# Patient Record
Sex: Female | Born: 1950 | Race: Black or African American | Hispanic: No | Smoking: Former smoker
Health system: Southern US, Community
[De-identification: ages and names within clinical notes are randomized; demographics above are authoritative.]

## PROBLEM LIST (undated history)

## (undated) DIAGNOSIS — I10 Essential (primary) hypertension: Secondary | ICD-10-CM

## (undated) DIAGNOSIS — K219 Gastro-esophageal reflux disease without esophagitis: Secondary | ICD-10-CM

## (undated) HISTORY — DX: Gastro-esophageal reflux disease without esophagitis: K21.9

## (undated) HISTORY — DX: Essential (primary) hypertension: I10

## (undated) HISTORY — PX: TUBAL LIGATION: SHX77

---

## 2010-05-18 ENCOUNTER — Encounter: Admission: RE | Admit: 2010-05-18 | Discharge: 2010-05-18 | Payer: Self-pay | Admitting: Infectious Diseases

## 2011-01-23 ENCOUNTER — Encounter: Payer: Self-pay | Admitting: Internal Medicine

## 2011-01-23 ENCOUNTER — Ambulatory Visit (INDEPENDENT_AMBULATORY_CARE_PROVIDER_SITE_OTHER): Payer: BC Managed Care – PPO | Admitting: Internal Medicine

## 2011-01-23 DIAGNOSIS — K219 Gastro-esophageal reflux disease without esophagitis: Secondary | ICD-10-CM | POA: Insufficient documentation

## 2011-01-23 DIAGNOSIS — H6120 Impacted cerumen, unspecified ear: Secondary | ICD-10-CM

## 2011-01-23 DIAGNOSIS — Z0000 Encounter for general adult medical examination without abnormal findings: Secondary | ICD-10-CM

## 2011-01-23 DIAGNOSIS — I10 Essential (primary) hypertension: Secondary | ICD-10-CM | POA: Insufficient documentation

## 2011-01-23 DIAGNOSIS — M25579 Pain in unspecified ankle and joints of unspecified foot: Secondary | ICD-10-CM | POA: Insufficient documentation

## 2011-01-23 DIAGNOSIS — R209 Unspecified disturbances of skin sensation: Secondary | ICD-10-CM | POA: Insufficient documentation

## 2011-01-24 ENCOUNTER — Other Ambulatory Visit: Payer: BC Managed Care – PPO

## 2011-01-24 ENCOUNTER — Encounter (INDEPENDENT_AMBULATORY_CARE_PROVIDER_SITE_OTHER): Payer: Self-pay | Admitting: *Deleted

## 2011-01-24 ENCOUNTER — Other Ambulatory Visit: Payer: Self-pay | Admitting: Internal Medicine

## 2011-01-24 DIAGNOSIS — M79609 Pain in unspecified limb: Secondary | ICD-10-CM

## 2011-01-24 DIAGNOSIS — R209 Unspecified disturbances of skin sensation: Secondary | ICD-10-CM

## 2011-01-24 LAB — URINALYSIS
Hgb urine dipstick: NEGATIVE
Ketones, ur: NEGATIVE
Leukocytes, UA: NEGATIVE
Nitrite: NEGATIVE
Specific Gravity, Urine: 1.01 (ref 1.000–1.030)
Total Protein, Urine: NEGATIVE
Urine Glucose: NEGATIVE
pH: 7.5 (ref 5.0–8.0)

## 2011-01-24 LAB — BASIC METABOLIC PANEL
BUN: 8 mg/dL (ref 6–23)
CO2: 30 mEq/L (ref 19–32)
Calcium: 9.8 mg/dL (ref 8.4–10.5)
Chloride: 106 mEq/L (ref 96–112)
GFR: 101.85 mL/min (ref >60.00)
Glucose, Bld: 91 mg/dL (ref 70–99)
Potassium: 4.2 mEq/L (ref 3.5–5.1)

## 2011-01-24 LAB — CBC WITH DIFFERENTIAL/PLATELET
Basophils Absolute: 0 10*3/uL (ref 0.0–0.1)
Eosinophils Absolute: 0.1 10*3/uL (ref 0.0–0.7)
HCT: 40.6 % (ref 36.0–46.0)
Lymphocytes Relative: 54.6 % — ABNORMAL HIGH (ref 12.0–46.0)
Monocytes Absolute: 0.3 10*3/uL (ref 0.1–1.0)
Platelets: 236 10*3/uL (ref 150.0–400.0)

## 2011-01-24 LAB — URIC ACID: Uric Acid, Serum: 4.1 mg/dL (ref 2.4–7.0)

## 2011-01-24 LAB — VITAMIN B12: Vitamin B-12: 1118 pg/mL — ABNORMAL HIGH (ref 211–911)

## 2011-01-24 LAB — LIPID PANEL
Total CHOL/HDL Ratio: 3
Triglycerides: 55 mg/dL (ref 0.0–149.0)

## 2011-01-25 ENCOUNTER — Telehealth: Payer: Self-pay | Admitting: Internal Medicine

## 2011-02-01 NOTE — Progress Notes (Signed)
Summary: RESULTS  Phone Note Call from Patient Call back at [facility] Phone [phone]   Summary of Call: Pt recieved results of labs today but says she did not get u/a results. Per MD - it is normal. Need to call pt to inform.  Initial call taken by: Lamar Sprinkles, CMA,  January 25, 2011 12:39 PM  Follow-up for Phone Call        left vm for pt, u/a ok  Follow-up by: Lamar Sprinkles, CMA,  January 25, 2011 5:39 PM

## 2011-02-01 NOTE — Assessment & Plan Note (Signed)
Summary: New / Bcbs / # / Dr Posey Rea ok'd   Vital Signs:  Patient profile:   60 year old female Height:      62.5 inches Weight:      140 pounds BMI:     25.29 Temp:     98.6 degrees F oral Pulse rate:   92 / minute Pulse rhythm:   regular Resp:     16 per minute BP sitting:   148 / 98  (left arm) Cuff size:   regular  Vitals Entered By: Lanier Prude, CMA(AAMA) (January 23, 2011 3:02 PM) CC: Est PCP Is Patient Diabetic? No   CC:  Est PCP.  History of Present Illness: New pt -  The patient presents for a preventive health examination  C/o R ankle was swollen and tender x 1 month, well now  Preventive Screening-Counseling & Management  Alcohol-Tobacco     Smoking Status: never  Caffeine-Diet-Exercise     Does Patient Exercise: yes  Current Medications (verified): 1)  Atenolol 50 Mg Tabs (Atenolol) .Marland Kitchen.. 1 By Mouth Once Daily 2)  Prilosec 20 Mg Cpdr (Omeprazole) .Marland Kitchen.. 1 By Mouth Once Daily 3)  Mag-Sr Plus Calcium 535 (64 Mg) Mg Cr-Tabs (Magnesium Chloride) .Marland Kitchen.. 1 By Mouth Once Daily 4)  Biotin 10 Mg Tabs (Biotin) .Marland Kitchen.. 1 By Mouth Once Daily 5)  Potassium Gluconate 2 Meq Tabs (Potassium Gluconate) .Marland Kitchen.. 1 By Mouth Once Daily 6)  Vitamin C 500 Mg Tabs (Ascorbic Acid) .Marland Kitchen.. 1 By Mouth Once Daily 7)  Fish Oil 300 Mg Caps (Omega-3 Fatty Acids) .Marland Kitchen.. 1 By Mouth Once Daily 8)  Vitamin B-12 100 Mcg Tabs (Cyanocobalamin) .Marland Kitchen.. 1 By Mouth Once Daily 9)  Vitamin D3 400 Unit Tabs (Cholecalciferol) .Marland Kitchen.. 1 By Mouth Once Daily  Allergies (verified): No Known Drug Allergies  Past History:  Past Medical History: ?gout Hypertension GERD  Past Surgical History: Tubal ligation Colonosc 2009 in [place]  Family History: F gout Family History Diabetes 1st degree relative Family History Hypertension  Social History: Occupation: caregiver Widow/Widower Never Smoked Alcohol use-no Regular exercise-yes Smoking Status:  never Does Patient Exercise:  yes  Review of  Systems  The patient denies anorexia, fever, weight loss, weight gain, vision loss, decreased hearing, hoarseness, chest pain, syncope, dyspnea on exertion, peripheral edema, prolonged cough, headaches, hemoptysis, abdominal pain, melena, hematochezia, severe indigestion/heartburn, hematuria, incontinence, genital sores, muscle weakness, suspicious skin lesions, transient blindness, difficulty walking, depression, unusual weight change, abnormal bleeding, enlarged lymph nodes, angioedema, and breast masses.         BP has been nl off meds  Physical Exam  General:  Well-developed,well-nourished,in no acute distress; alert,appropriate and cooperative throughout examination Head:  Normocephalic and atraumatic without obvious abnormalities. No apparent alopecia or balding. Eyes:  No corneal or conjunctival inflammation noted. EOMI. Perrla. Ears:  External ear exam shows no significant lesions or deformities.  Otoscopic examination reveals clear canals, tympanic membranes are intact bilaterally without bulging, retraction, inflammation or discharge after irrigation.Large wax B Nose:  External nasal examination shows no deformity or inflammation. Nasal mucosa are pink and moist without lesions or exudates. Mouth:  Oral mucosa and oropharynx without lesions or exudates.  Teeth in good repair. Neck:  No deformities, masses, or tenderness noted. Lungs:  Normal respiratory effort, chest expands symmetrically. Lungs are clear to auscultation, no crackles or wheezes. Heart:  Normal rate and regular rhythm. S1 and S2 normal without gallop, murmur, click, rub or other extra sounds. Abdomen:  Bowel sounds positive,abdomen soft  and non-tender without masses, organomegaly or hernias noted. Msk:  No deformity or scoliosis noted of thoracic or lumbar spine.   Pulses:  R and L carotid,radial,femoral,dorsalis pedis and posterior tibial pulses are full and equal bilaterally Extremities:  No clubbing, cyanosis, edema,  or deformity noted with normal full range of motion of all joints.   Neurologic:  No cranial nerve deficits noted. Station and gait are normal. Plantar reflexes are down-going bilaterally. DTRs are symmetrical throughout. Sensory, motor and coordinative functions appear intact. Skin:  Intact without suspicious lesions or rashes Cervical Nodes:  No lymphadenopathy noted Psych:  Cognition and judgment appear intact. Alert and cooperative with normal attention span and concentration. No apparent delusions, illusions, hallucinations   Impression & Recommendations:  Problem # 1:  HEALTH MAINTENANCE EXAM (ICD-V70.0) Assessment New See your GYN. Get a mammo. Colon iis up to date. Get labs Health and age related issues were discussed. Available screening tests and vaccinations were discussed as well. Healthy life style including good diet and exercise was discussed.  Orders: EKG w/ Interpretation (93000)OK  Problem # 2:  HYPERTENSION (ICD-401.9) Assessment: Comment Only Nl BP off Rx Her updated medication list for this problem includes:    Atenolol 50 Mg Tabs (Atenolol) .Marland Kitchen... 1 by mouth once daily  Problem # 3:  GERD (ICD-530.81) Assessment: Improved  Her updated medication list for this problem includes:    Prilosec 20 Mg Cpdr (Omeprazole) .Marland Kitchen... 1 by mouth once daily  Problem # 4:  ANKLE PAIN (ICD-719.47) R - resolved Assessment: New r/o gout  Problem # 5:  CERUMEN IMPACTION (ICD-380.4) B Assessment: New  Orders: Cerumen Impaction Removal (04540) Procedure: ear irrigation Reason: wax impaction B Risks/benefis were discussed. Both ears were irrigated with warm water. Large ammount of wax was recovered. Instrumentation with metal ear loop was performed to accomplish the removal. Tolerated well Complications: none, except for a minor bleeding on L    Complete Medication List: 1)  Atenolol 50 Mg Tabs (Atenolol) .Marland Kitchen.. 1 by mouth once daily 2)  Prilosec 20 Mg Cpdr (Omeprazole) .Marland Kitchen..  1 by mouth once daily 3)  Mag-sr Plus Calcium 535 (64 Mg) Mg Cr-tabs (Magnesium chloride) .Marland Kitchen.. 1 by mouth once daily 4)  Biotin 10 Mg Tabs (Biotin) .Marland Kitchen.. 1 by mouth once daily 5)  Potassium Gluconate 2 Meq Tabs (Potassium gluconate) .Marland Kitchen.. 1 by mouth once daily 6)  Vitamin C 500 Mg Tabs (Ascorbic acid) .Marland Kitchen.. 1 by mouth once daily 7)  Fish Oil 300 Mg Caps (Omega-3 fatty acids) .Marland Kitchen.. 1 by mouth once daily 8)  Vitamin B-12 100 Mcg Tabs (Cyanocobalamin) .Marland Kitchen.. 1 by mouth once daily 9)  Vitamin D3 400 Unit Tabs (Cholecalciferol) .Marland Kitchen.. 1 by mouth once daily 10)  Cortisporin 3.5-10000-1 Soln (Neomycin-polymyxin-hc) .... 3 gtt in r ear tid  Patient Instructions: 1)  Tomorrow:v70.0 2)  BMP prior to visit, ICD-9: 3)  Hepatic Panel prior to visit, ICD-9: 4)  Lipid Panel prior to visit, ICD-9: 5)  TSH prior to visit, ICD-9: 6)  CBC w/ Diff prior to visit, ICD-9: 7)  Urine-dip prior to visit, ICD-9: 8)  uric acid 729.5 9)  Please schedule a follow-up appointment in 3 months. Prescriptions: CORTISPORIN 3.5-10000-1 SOLN (NEOMYCIN-POLYMYXIN-HC) 3 gtt in R ear tid  #1 x 1   Entered and Authorized by:   Tresa Garter MD   Signed by:   Tresa Garter MD on 01/23/2011   Method used:   Print then Give to Patient   RxID:  [id number]    Orders Added: 1)  New Patient 40-64 years [99386] 2)  EKG w/ Interpretation [93000] 3)  Cerumen Impaction Removal [69210]

## 2011-04-02 ENCOUNTER — Encounter: Payer: Self-pay | Admitting: Internal Medicine

## 2011-04-02 ENCOUNTER — Ambulatory Visit (INDEPENDENT_AMBULATORY_CARE_PROVIDER_SITE_OTHER): Payer: BC Managed Care – PPO | Admitting: Internal Medicine

## 2011-04-02 DIAGNOSIS — R519 Headache, unspecified: Secondary | ICD-10-CM | POA: Insufficient documentation

## 2011-04-02 DIAGNOSIS — J019 Acute sinusitis, unspecified: Secondary | ICD-10-CM

## 2011-04-02 DIAGNOSIS — R51 Headache: Secondary | ICD-10-CM

## 2011-04-02 MED ORDER — AMOXICILLIN 500 MG PO CAPS: 1000.0000 mg | ORAL_CAPSULE | Freq: Two times a day (BID) | ORAL | Status: AC

## 2011-04-02 MED ORDER — IBUPROFEN 600 MG PO TABS: ORAL_TABLET | ORAL | Status: AC

## 2011-04-02 NOTE — Progress Notes (Signed)
  Subjective:    Patient ID: Dana Long, female    DOB: Jul 09, 1951, 60 y.o.   MRN: [id number]  HPI  C/o L face pain x 2-3 wks - her dentist said her L sinus was full  Review of Systems  Constitutional: Negative for fever.  HENT: Negative for hearing loss, nosebleeds, trouble swallowing, neck pain and sinus pressure.   Genitourinary: Negative for frequency.  Skin: Negative for rash.  Neurological: Negative for weakness.  Psychiatric/Behavioral: Negative for suicidal ideas and dysphoric mood.       Objective:   Physical Exam  Constitutional: She appears well-developed and well-nourished. No distress.  HENT:  Head: Normocephalic.  Right Ear: External ear normal.  Left Ear: External ear normal.  Nose: Nose normal.  Mouth/Throat: Oropharynx is clear and moist.  Eyes: Conjunctivae are normal. Pupils are equal, round, and reactive to light. Right eye exhibits no discharge. Left eye exhibits no discharge.  Neck: Normal range of motion. Neck supple. No JVD present. No tracheal deviation present. No thyromegaly present.  Cardiovascular: Normal rate, regular rhythm and normal heart sounds.   Pulmonary/Chest: No stridor. No respiratory distress. She has no wheezes.  Abdominal: Soft. Bowel sounds are normal. She exhibits no distension and no mass. There is no tenderness. There is no rebound and no guarding.  Musculoskeletal: She exhibits no edema and no tenderness.       L TMJ is tender  Lymphadenopathy:    She has no cervical adenopathy.  Neurological: She displays normal reflexes. No cranial nerve deficit. She exhibits normal muscle tone. Coordination normal.  Skin: No rash noted. No erythema.  Psychiatric: She has a normal mood and affect. Her behavior is normal. Judgment and thought content normal.          Assessment & Plan:   Headache Sinusitis vs TMJ - see meds  Sinusitis acute Amoxicillin x 10 d

## 2011-04-02 NOTE — Assessment & Plan Note (Signed)
Sinusitis vs TMJ - see meds

## 2011-04-02 NOTE — Assessment & Plan Note (Signed)
Amoxicillin x10d 

## 2011-04-20 ENCOUNTER — Other Ambulatory Visit: Payer: Self-pay | Admitting: Internal Medicine

## 2011-04-20 ENCOUNTER — Other Ambulatory Visit (INDEPENDENT_AMBULATORY_CARE_PROVIDER_SITE_OTHER): Payer: BC Managed Care – PPO

## 2011-04-20 DIAGNOSIS — T887XXA Unspecified adverse effect of drug or medicament, initial encounter: Secondary | ICD-10-CM

## 2011-04-20 DIAGNOSIS — I10 Essential (primary) hypertension: Secondary | ICD-10-CM

## 2011-04-24 ENCOUNTER — Encounter: Payer: Self-pay | Admitting: Internal Medicine

## 2011-04-25 ENCOUNTER — Encounter: Payer: Self-pay | Admitting: Internal Medicine

## 2011-04-25 ENCOUNTER — Ambulatory Visit (INDEPENDENT_AMBULATORY_CARE_PROVIDER_SITE_OTHER): Payer: BC Managed Care – PPO | Admitting: Internal Medicine

## 2011-04-25 DIAGNOSIS — I10 Essential (primary) hypertension: Secondary | ICD-10-CM

## 2011-04-25 DIAGNOSIS — J019 Acute sinusitis, unspecified: Secondary | ICD-10-CM

## 2011-04-25 DIAGNOSIS — J309 Allergic rhinitis, unspecified: Secondary | ICD-10-CM

## 2011-04-25 DIAGNOSIS — R51 Headache: Secondary | ICD-10-CM

## 2011-04-25 DIAGNOSIS — K219 Gastro-esophageal reflux disease without esophagitis: Secondary | ICD-10-CM

## 2011-04-25 DIAGNOSIS — J302 Other seasonal allergic rhinitis: Secondary | ICD-10-CM

## 2011-04-25 MED ORDER — FLUTICASONE PROPIONATE 50 MCG/ACT NA SUSP: 2.0000 | Freq: Every day | NASAL | Status: DC

## 2011-04-25 MED ORDER — LORATADINE 10 MG PO TABS: 10.0000 mg | ORAL_TABLET | Freq: Every day | ORAL | Status: DC

## 2011-04-25 MED ORDER — MOXIFLOXACIN HCL 400 MG PO TABS: 400.0000 mg | ORAL_TABLET | Freq: Every day | ORAL | Status: DC

## 2011-04-25 MED ORDER — TRIAMTERENE-HCTZ 37.5-25 MG PO TABS: 1.0000 | ORAL_TABLET | Freq: Every day | ORAL | Status: DC

## 2011-04-25 NOTE — Patient Instructions (Signed)
Use Nasal rinse

## 2011-04-25 NOTE — Assessment & Plan Note (Signed)
BP Readings from Last 3 Encounters:  04/25/11 156/90  04/02/11 130/90  01/23/11 148/98  Given Maxzide

## 2011-04-25 NOTE — Assessment & Plan Note (Addendum)
CT head if not better after Rx

## 2011-04-25 NOTE — Progress Notes (Signed)
  Subjective:    Patient ID: Dana Long, female    DOB: 1951/10/18, 60 y.o.   MRN: [id number]  HPI  C/o HA in L and occipital area Abx helped some. No chills. F/u sinusitis - 30% better on abx. States BP is OK at home  Review of Systems  Constitutional: Positive for fatigue. Negative for activity change and unexpected weight change.  HENT: Positive for congestion, sneezing, postnasal drip and sinus pressure. Negative for neck pain.   Eyes: Negative for photophobia, pain and visual disturbance.  Cardiovascular: Negative for leg swelling.  Skin: Negative for rash.  Neurological: Negative for syncope.  Psychiatric/Behavioral: Negative for dysphoric mood.       Objective:   Physical Exam  Constitutional: She appears well-developed and well-nourished. No distress.  HENT:  Head: Normocephalic.  Right Ear: External ear normal.  Left Ear: External ear normal.  Nose: Nose normal.  Mouth/Throat: Oropharynx is clear and moist.  Eyes: Conjunctivae are normal. Pupils are equal, round, and reactive to light. Right eye exhibits no discharge. Left eye exhibits no discharge.  Neck: Normal range of motion. Neck supple. No JVD present. No tracheal deviation present. No thyromegaly present.  Cardiovascular: Normal rate, regular rhythm and normal heart sounds.   Pulmonary/Chest: No stridor. No respiratory distress. She has no wheezes.  Abdominal: Soft. Bowel sounds are normal. She exhibits no distension and no mass. There is no tenderness. There is no rebound and no guarding.  Musculoskeletal: She exhibits no edema and no tenderness.  Lymphadenopathy:    She has no cervical adenopathy.  Neurological: She displays normal reflexes. No cranial nerve deficit. She exhibits normal muscle tone. Coordination normal.  Skin: No rash noted. No erythema.  Psychiatric: She has a normal mood and affect. Her behavior is normal. Judgment and thought content normal.  L TMJ tender L sinus ?tender         Assessment & Plan:  Sinusitis acute Acute vs chronic Irrigate. Finish Amoxicillin She is leaving x 2 wks on Sat Pick up Avelox Flonase   HYPERTENSION BP Readings from Last 3 Encounters:  04/25/11 156/90  04/02/11 130/90  01/23/11 148/98  Given Maxzide   Headache CT head if not better after Rx  GERD On Rx  Allergic rhinitis, seasonal Start Claritin

## 2011-04-25 NOTE — Assessment & Plan Note (Signed)
On Rx 

## 2011-04-25 NOTE — Assessment & Plan Note (Signed)
Acute vs chronic Irrigate. Finish Amoxicillin She is leaving x 2 wks on Sat Pick up Avelox Flonase

## 2011-04-25 NOTE — Assessment & Plan Note (Signed)
Start Claritin 

## 2011-07-20 ENCOUNTER — Other Ambulatory Visit (INDEPENDENT_AMBULATORY_CARE_PROVIDER_SITE_OTHER): Payer: BC Managed Care – PPO

## 2011-07-20 DIAGNOSIS — I10 Essential (primary) hypertension: Secondary | ICD-10-CM

## 2011-07-20 LAB — BASIC METABOLIC PANEL
BUN: 11 mg/dL (ref 6–23)
Chloride: 96 mEq/L (ref 96–112)
Potassium: 3.5 mEq/L (ref 3.5–5.1)

## 2011-07-24 ENCOUNTER — Ambulatory Visit (INDEPENDENT_AMBULATORY_CARE_PROVIDER_SITE_OTHER): Payer: BC Managed Care – PPO | Admitting: Internal Medicine

## 2011-07-24 ENCOUNTER — Encounter: Payer: Self-pay | Admitting: Internal Medicine

## 2011-07-24 VITALS — BP 130/98 | HR 76 | Temp 97.8°F | Resp 16

## 2011-07-24 DIAGNOSIS — R51 Headache: Secondary | ICD-10-CM

## 2011-07-24 DIAGNOSIS — R7611 Nonspecific reaction to tuberculin skin test without active tuberculosis: Secondary | ICD-10-CM

## 2011-07-24 DIAGNOSIS — J019 Acute sinusitis, unspecified: Secondary | ICD-10-CM

## 2011-07-24 MED ORDER — VITAMIN D 1000 UNITS PO TABS: 1000.0000 [IU] | ORAL_TABLET | Freq: Every day | ORAL | Status: DC

## 2011-07-24 MED ORDER — AMOXICILLIN 500 MG PO CAPS: 1000.0000 mg | ORAL_CAPSULE | Freq: Two times a day (BID) | ORAL | Status: AC

## 2011-07-24 NOTE — Assessment & Plan Note (Signed)
Not much better. CT sinuses Amoxicillin if worse

## 2011-07-24 NOTE — Assessment & Plan Note (Signed)
Relapsed. CT. Amoxicillin

## 2011-07-24 NOTE — Assessment & Plan Note (Signed)
Will get a CXR 

## 2011-07-24 NOTE — Progress Notes (Signed)
  Subjective:    Patient ID: Dana Long, female    DOB: 1951-01-02, 60 y.o.   MRN: [id number]  HPI    The patient is here to follow up on  headaches and chronic sinus sx's symptoms x several weeks. 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  HENT: Positive for congestion. Negative for mouth sores and sinus pressure.   Eyes: Negative for visual disturbance.  Respiratory: Negative for cough and chest tightness.   Gastrointestinal: Negative for nausea and abdominal pain.  Genitourinary: Negative for frequency, difficulty urinating and vaginal pain.  Musculoskeletal: Negative for back pain and gait problem.  Skin: Negative for pallor and rash.  Neurological: Negative for dizziness, tremors, weakness, numbness and headaches.  Psychiatric/Behavioral: Negative for confusion and sleep disturbance.       Objective:   Physical Exam  Constitutional: She appears well-developed and well-nourished. No distress.  HENT:  Head: Normocephalic.  Right Ear: External ear normal.  Left Ear: External ear normal.  Nose: Nose normal.  Mouth/Throat: Oropharynx is clear and moist.  Eyes: Conjunctivae are normal. Pupils are equal, round, and reactive to light. Right eye exhibits no discharge. Left eye exhibits no discharge.  Neck: Normal range of motion. Neck supple. No JVD present. No tracheal deviation present. No thyromegaly present.  Cardiovascular: Normal rate, regular rhythm and normal heart sounds.   Pulmonary/Chest: No stridor. No respiratory distress. She has no wheezes.  Abdominal: Soft. Bowel sounds are normal. She exhibits no distension and no mass. There is no tenderness. There is no rebound and no guarding.  Musculoskeletal: She exhibits no edema and no tenderness.  Lymphadenopathy:    She has no cervical adenopathy.  Neurological: She displays normal  reflexes. No cranial nerve deficit. She exhibits normal muscle tone. Coordination normal.  Skin: No rash noted. No erythema.  Psychiatric: She has a normal mood and affect. Her behavior is normal. Judgment and thought content normal.          Assessment & Plan:

## 2011-07-27 ENCOUNTER — Encounter: Payer: Self-pay | Admitting: Internal Medicine

## 2011-08-03 ENCOUNTER — Ambulatory Visit (INDEPENDENT_AMBULATORY_CARE_PROVIDER_SITE_OTHER)
Admission: RE | Admit: 2011-08-03 | Discharge: 2011-08-03 | Disposition: A | Discharge status: Home / Self Care | Payer: BC Managed Care – PPO | Source: Ambulatory Visit | Attending: Internal Medicine | Admitting: Internal Medicine

## 2011-08-03 DIAGNOSIS — R51 Headache: Secondary | ICD-10-CM

## 2011-08-03 DIAGNOSIS — J019 Acute sinusitis, unspecified: Secondary | ICD-10-CM

## 2011-08-10 ENCOUNTER — Telehealth: Payer: Self-pay | Admitting: *Deleted

## 2011-08-10 NOTE — Telephone Encounter (Signed)
CT was clear - good news! Thx

## 2011-08-10 NOTE — Telephone Encounter (Signed)
Pt is calling requesting results of recent CT scan. Please advise.

## 2011-08-10 NOTE — Telephone Encounter (Signed)
Patient informed. 

## 2011-12-25 ENCOUNTER — Encounter: Payer: Self-pay | Admitting: Internal Medicine

## 2011-12-25 ENCOUNTER — Ambulatory Visit (INDEPENDENT_AMBULATORY_CARE_PROVIDER_SITE_OTHER): Payer: BC Managed Care – PPO | Admitting: Internal Medicine

## 2011-12-25 DIAGNOSIS — I10 Essential (primary) hypertension: Secondary | ICD-10-CM

## 2011-12-25 DIAGNOSIS — K219 Gastro-esophageal reflux disease without esophagitis: Secondary | ICD-10-CM

## 2011-12-25 DIAGNOSIS — R51 Headache: Secondary | ICD-10-CM

## 2011-12-25 MED ORDER — FLUTICASONE PROPIONATE 50 MCG/ACT NA SUSP: 2.0000 | Freq: Every day | NASAL | Status: DC

## 2011-12-25 NOTE — Assessment & Plan Note (Signed)
Continue with current prescription therapy as reflected on the Med list.  

## 2011-12-25 NOTE — Progress Notes (Signed)
  Subjective:    Patient ID: Dana Long, female    DOB: 06-27-1951, 61 y.o.   MRN: [id number]  HPI The patient presents for a follow-up of  chronic hypertension, chronic dyslipidemia, GERD, controlled with medicines     Review of Systems  Constitutional: Negative for chills, activity change, appetite change, fatigue and unexpected weight change.  HENT: Negative for congestion, mouth sores and sinus pressure.   Eyes: Negative for visual disturbance.  Respiratory: Negative for cough and chest tightness.   Gastrointestinal: Negative for nausea and abdominal pain.  Genitourinary: Negative for frequency, difficulty urinating and vaginal pain.  Musculoskeletal: Negative for back pain and gait problem.  Skin: Negative for pallor and rash.  Neurological: Negative for dizziness, tremors, weakness, numbness and headaches.  Psychiatric/Behavioral: Negative for confusion and sleep disturbance.       Objective:   Physical Exam  Constitutional: She appears well-developed. No distress.  HENT:  Head: Normocephalic.  Right Ear: External ear normal.  Left Ear: External ear normal.  Nose: Nose normal.  Mouth/Throat: Oropharynx is clear and moist.  Eyes: Conjunctivae are normal. Pupils are equal, round, and reactive to light. Right eye exhibits no discharge. Left eye exhibits no discharge.  Neck: Normal range of motion. Neck supple. No JVD present. No tracheal deviation present. No thyromegaly present.  Cardiovascular: Normal rate, regular rhythm and normal heart sounds.   Pulmonary/Chest: No stridor. No respiratory distress. She has no wheezes.  Abdominal: Soft. Bowel sounds are normal. She exhibits no distension and no mass. There is no tenderness. There is no rebound and no guarding.  Musculoskeletal: She exhibits no edema and no tenderness.  Lymphadenopathy:    She has no cervical adenopathy.  Neurological: She displays normal reflexes. No cranial nerve deficit. She exhibits normal muscle  tone. Coordination normal.  Skin: No rash noted. No erythema.  Psychiatric: She has a normal mood and affect. Her behavior is normal. Judgment and thought content normal.          Assessment & Plan:

## 2011-12-25 NOTE — Assessment & Plan Note (Signed)
Continue with Ibuprofen prn

## 2012-04-19 ENCOUNTER — Other Ambulatory Visit: Payer: Self-pay | Admitting: Internal Medicine

## 2012-05-01 ENCOUNTER — Telehealth: Payer: Self-pay | Admitting: Internal Medicine

## 2012-05-01 NOTE — Telephone Encounter (Signed)
Caller: Jessica/Child; PCP: PlotnikovTrinna Post; CB#: [phone];  Call regarding Nose Bleed 04/30/12;  Afebrile. Reports spontaneous, heavy, and hard to stop R nostril nosebleed during dinner lasting 7-8 minutes. Pt is at work. Had headaches week of 04/28/12. Unaware if checking BP at home or not. Home care advice given for single episode of bleeding that resolved with direct presssure per Nosebleed Guideline.

## 2012-05-01 NOTE — Telephone Encounter (Signed)
Agree. ENT appt if reoccurs Thx

## 2012-06-16 ENCOUNTER — Telehealth: Payer: Self-pay

## 2012-06-16 NOTE — Telephone Encounter (Signed)
Pt scheduled for 6 mth follow up, not wellness exam. No labs scheduled and pt informed of same.

## 2012-06-16 NOTE — Telephone Encounter (Signed)
Pt called to inquire whether MD wanted her to have labs prior to appt 07/30, please advise.

## 2012-06-16 NOTE — Telephone Encounter (Signed)
If it is a well visit - get CBC, CMET, TSH, UA, lipids Thx

## 2012-06-24 ENCOUNTER — Ambulatory Visit (INDEPENDENT_AMBULATORY_CARE_PROVIDER_SITE_OTHER): Payer: BC Managed Care – PPO | Admitting: Internal Medicine

## 2012-06-24 ENCOUNTER — Other Ambulatory Visit (INDEPENDENT_AMBULATORY_CARE_PROVIDER_SITE_OTHER): Payer: BC Managed Care – PPO

## 2012-06-24 ENCOUNTER — Encounter: Payer: Self-pay | Admitting: Internal Medicine

## 2012-06-24 VITALS — BP 138/100 | HR 80 | Temp 98.1°F | Resp 16 | Wt 139.0 lb

## 2012-06-24 DIAGNOSIS — I10 Essential (primary) hypertension: Secondary | ICD-10-CM

## 2012-06-24 DIAGNOSIS — K219 Gastro-esophageal reflux disease without esophagitis: Secondary | ICD-10-CM

## 2012-06-24 DIAGNOSIS — J309 Allergic rhinitis, unspecified: Secondary | ICD-10-CM

## 2012-06-24 DIAGNOSIS — J302 Other seasonal allergic rhinitis: Secondary | ICD-10-CM

## 2012-06-24 LAB — BASIC METABOLIC PANEL
Creatinine, Ser: 0.9 mg/dL (ref 0.4–1.2)
Potassium: 3.5 mEq/L (ref 3.5–5.1)

## 2012-06-24 MED ORDER — LORATADINE 10 MG PO TABS: 10.0000 mg | ORAL_TABLET | Freq: Every day | ORAL | Status: DC

## 2012-06-24 NOTE — Assessment & Plan Note (Signed)
Continue with current prescription therapy as reflected on the Med list.  

## 2012-06-24 NOTE — Patient Instructions (Addendum)
BP Readings from Last 3 Encounters:  06/24/12 138/100  12/25/11 120/90  07/24/11 130/98   Wt Readings from Last 3 Encounters:  06/24/12 139 lb (63.05 kg)  12/25/11 147 lb 0.6 oz (66.697 kg)  04/25/11 142 lb (64.411 kg)

## 2012-06-24 NOTE — Progress Notes (Signed)
   Subjective:    Patient ID: Dana Long, female    DOB: 1950-12-06, 61 y.o.   MRN: [id number]  HPI The patient presents for a follow-up of  chronic hypertension, chronic dyslipidemia, GERD, controlled with medicines     Review of Systems  Constitutional: Negative for chills, activity change, appetite change, fatigue and unexpected weight change.  HENT: Negative for congestion, mouth sores and sinus pressure.   Eyes: Negative for visual disturbance.  Respiratory: Negative for cough and chest tightness.   Gastrointestinal: Negative for nausea and abdominal pain.  Genitourinary: Negative for frequency, difficulty urinating and vaginal pain.  Musculoskeletal: Negative for back pain and gait problem.  Skin: Negative for pallor and rash.  Neurological: Negative for dizziness, tremors, weakness, numbness and headaches.  Psychiatric/Behavioral: Negative for confusion and disturbed wake/sleep cycle.       Objective:   Physical Exam  Constitutional: She appears well-developed. No distress.  HENT:  Head: Normocephalic.  Right Ear: External ear normal.  Left Ear: External ear normal.  Nose: Nose normal.  Mouth/Throat: Oropharynx is clear and moist.  Eyes: Conjunctivae are normal. Pupils are equal, round, and reactive to light. Right eye exhibits no discharge. Left eye exhibits no discharge.  Neck: Normal range of motion. Neck supple. No JVD present. No tracheal deviation present. No thyromegaly present.  Cardiovascular: Normal rate, regular rhythm and normal heart sounds.   Pulmonary/Chest: No stridor. No respiratory distress. She has no wheezes.  Abdominal: Soft. Bowel sounds are normal. She exhibits no distension and no mass. There is no tenderness. There is no rebound and no guarding.  Musculoskeletal: She exhibits no edema and no tenderness.  Lymphadenopathy:    She has no cervical adenopathy.  Neurological: She displays normal reflexes. No cranial nerve deficit. She exhibits  normal muscle tone. Coordination normal.  Skin: No rash noted. No erythema.  Psychiatric: She has a normal mood and affect. Her behavior is normal. Judgment and thought content normal.          Assessment & Plan:

## 2012-10-28 ENCOUNTER — Telehealth: Payer: Self-pay | Admitting: *Deleted

## 2012-10-28 DIAGNOSIS — I10 Essential (primary) hypertension: Secondary | ICD-10-CM

## 2012-10-28 NOTE — Telephone Encounter (Signed)
BMET, Lipids Thx

## 2012-10-28 NOTE — Telephone Encounter (Signed)
Pt wants to know if she needs labs done prior to her 11/14/12 OV. Please advise.

## 2012-11-12 ENCOUNTER — Other Ambulatory Visit (INDEPENDENT_AMBULATORY_CARE_PROVIDER_SITE_OTHER): Payer: BC Managed Care – PPO

## 2012-11-12 DIAGNOSIS — I10 Essential (primary) hypertension: Secondary | ICD-10-CM

## 2012-11-12 LAB — BASIC METABOLIC PANEL
BUN: 14 mg/dL (ref 6–23)
Calcium: 10 mg/dL (ref 8.4–10.5)
Glucose, Bld: 97 mg/dL (ref 70–99)
Sodium: 139 mEq/L (ref 135–145)

## 2012-11-12 LAB — LIPID PANEL
Cholesterol: 198 mg/dL (ref 0–200)
LDL Cholesterol: 116 mg/dL — ABNORMAL HIGH (ref 0–99)
Total CHOL/HDL Ratio: 3

## 2012-11-14 ENCOUNTER — Ambulatory Visit (INDEPENDENT_AMBULATORY_CARE_PROVIDER_SITE_OTHER)
Admission: RE | Admit: 2012-11-14 | Discharge: 2012-11-14 | Disposition: A | Discharge status: Home / Self Care | Payer: BC Managed Care – PPO | Source: Ambulatory Visit | Attending: Internal Medicine | Admitting: Internal Medicine

## 2012-11-14 ENCOUNTER — Ambulatory Visit (INDEPENDENT_AMBULATORY_CARE_PROVIDER_SITE_OTHER): Payer: BC Managed Care – PPO | Admitting: Internal Medicine

## 2012-11-14 ENCOUNTER — Encounter: Payer: Self-pay | Admitting: Internal Medicine

## 2012-11-14 VITALS — BP 140/90 | HR 80 | Temp 97.9°F | Resp 16 | Wt 142.0 lb

## 2012-11-14 DIAGNOSIS — R7611 Nonspecific reaction to tuberculin skin test without active tuberculosis: Secondary | ICD-10-CM

## 2012-11-14 DIAGNOSIS — Z23 Encounter for immunization: Secondary | ICD-10-CM

## 2012-11-14 DIAGNOSIS — I10 Essential (primary) hypertension: Secondary | ICD-10-CM

## 2012-11-14 DIAGNOSIS — Z0000 Encounter for general adult medical examination without abnormal findings: Secondary | ICD-10-CM | POA: Insufficient documentation

## 2012-11-14 NOTE — Progress Notes (Signed)
   Subjective:    Patient ID: Dana Long, female    DOB: 1951/11/24, 61 y.o.   MRN: [id number]  HPI  The patient is here for a wellness exam. The patient has been doing well overall without major physical or psychological issues going on lately.  She appears well, in no apparent distress.  Alert and oriented times three, pleasant and cooperative. Vital signs are as documented in vital signs section.  The patient presents for a follow-up of  chronic hypertension, chronic dyslipidemia, GERD, controlled with medicines  BP Readings from Last 3 Encounters:  11/14/12 140/90  06/24/12 138/100  12/25/11 120/90    Wt Readings from Last 3 Encounters:  11/14/12 142 lb (64.411 kg)  06/24/12 139 lb (63.05 kg)  12/25/11 147 lb 0.6 oz (66.697 kg)     Review of Systems  Constitutional: Negative for chills, activity change, appetite change, fatigue and unexpected weight change.  HENT: Negative for congestion, mouth sores and sinus pressure.   Eyes: Negative for visual disturbance.  Respiratory: Negative for cough and chest tightness.   Gastrointestinal: Negative for nausea and abdominal pain.  Genitourinary: Negative for frequency, difficulty urinating and vaginal pain.  Musculoskeletal: Negative for back pain and gait problem.  Skin: Negative for pallor and rash.  Neurological: Negative for dizziness, tremors, weakness, numbness and headaches.  Psychiatric/Behavioral: Negative for confusion and sleep disturbance.       Objective:   Physical Exam  Constitutional: She appears well-developed. No distress.  HENT:  Head: Normocephalic.  Right Ear: External ear normal.  Left Ear: External ear normal.  Nose: Nose normal.  Mouth/Throat: Oropharynx is clear and moist.  Eyes: Conjunctivae normal are normal. Pupils are equal, round, and reactive to light. Right eye exhibits no discharge. Left eye exhibits no discharge.  Neck: Normal range of motion. Neck supple. No JVD present. No tracheal  deviation present. No thyromegaly present.  Cardiovascular: Normal rate, regular rhythm and normal heart sounds.   Pulmonary/Chest: No stridor. No respiratory distress. She has no wheezes.  Abdominal: Soft. Bowel sounds are normal. She exhibits no distension and no mass. There is no tenderness. There is no rebound and no guarding.  Musculoskeletal: She exhibits no edema and no tenderness.  Lymphadenopathy:    She has no cervical adenopathy.  Neurological: She displays normal reflexes. No cranial nerve deficit. She exhibits normal muscle tone. Coordination normal.  Skin: No rash noted. No erythema.  Psychiatric: She has a normal mood and affect. Her behavior is normal. Judgment and thought content normal.    Lab Results  Component Value Date   WBC 4.0* 01/24/2011   HGB 13.6 01/24/2011   HCT 40.6 01/24/2011   PLT 236.0 01/24/2011   GLUCOSE 97 11/12/2012   CHOL 198 11/12/2012   TRIG 89.0 11/12/2012   HDL 64.00 11/12/2012   LDLCALC 116* 11/12/2012   NA 139 11/12/2012   K 3.7 11/12/2012   CL 101 11/12/2012   CREATININE 0.8 11/12/2012   BUN 14 11/12/2012   CO2 31 11/12/2012   TSH 0.64 04/20/2011         Assessment & Plan:

## 2012-11-14 NOTE — Assessment & Plan Note (Addendum)
We discussed age appropriate health related issues, including available/recomended screening tests and vaccinations. We discussed a need for adhering to healthy diet and exercise. Labs/EKG were reviewed/ordered. All questions were answered. She had a PAP, mammo Ophth exam is due Colon due in 2019  

## 2012-11-14 NOTE — Assessment & Plan Note (Signed)
Continue with current prescription therapy as reflected on the Med list.  

## 2012-11-14 NOTE — Assessment & Plan Note (Signed)
He has been getting CXR's - last in 2011 Blood test option was discussed

## 2012-12-23 ENCOUNTER — Ambulatory Visit: Payer: BC Managed Care – PPO | Admitting: Internal Medicine

## 2013-03-14 ENCOUNTER — Other Ambulatory Visit: Payer: Self-pay | Admitting: Internal Medicine

## 2013-03-16 ENCOUNTER — Encounter: Payer: Self-pay | Admitting: *Deleted

## 2013-05-09 ENCOUNTER — Other Ambulatory Visit: Payer: Self-pay | Admitting: Internal Medicine

## 2013-05-14 ENCOUNTER — Other Ambulatory Visit (INDEPENDENT_AMBULATORY_CARE_PROVIDER_SITE_OTHER): Payer: BC Managed Care – PPO

## 2013-05-14 DIAGNOSIS — Z0000 Encounter for general adult medical examination without abnormal findings: Secondary | ICD-10-CM

## 2013-05-14 DIAGNOSIS — I10 Essential (primary) hypertension: Secondary | ICD-10-CM

## 2013-05-14 DIAGNOSIS — R7611 Nonspecific reaction to tuberculin skin test without active tuberculosis: Secondary | ICD-10-CM

## 2013-05-14 LAB — HEPATIC FUNCTION PANEL
AST: 37 U/L (ref 0–37)
Albumin: 3.7 g/dL (ref 3.5–5.2)
Alkaline Phosphatase: 45 U/L (ref 39–117)
Total Protein: 7.6 g/dL (ref 6.0–8.3)

## 2013-05-14 LAB — URINALYSIS
Bilirubin Urine: NEGATIVE
Hgb urine dipstick: NEGATIVE
Ketones, ur: NEGATIVE
Nitrite: NEGATIVE
Total Protein, Urine: NEGATIVE
pH: 7.5 (ref 5.0–8.0)

## 2013-05-14 LAB — BASIC METABOLIC PANEL
CO2: 30 mEq/L (ref 19–32)
Calcium: 9.8 mg/dL (ref 8.4–10.5)
Glucose, Bld: 102 mg/dL — ABNORMAL HIGH (ref 70–99)
Potassium: 4 mEq/L (ref 3.5–5.1)
Sodium: 138 mEq/L (ref 135–145)

## 2013-05-15 ENCOUNTER — Encounter: Payer: Self-pay | Admitting: Internal Medicine

## 2013-05-15 ENCOUNTER — Ambulatory Visit (INDEPENDENT_AMBULATORY_CARE_PROVIDER_SITE_OTHER): Payer: BC Managed Care – PPO | Admitting: Internal Medicine

## 2013-05-15 VITALS — BP 130/90 | HR 76 | Temp 98.0°F | Resp 16 | Wt 141.0 lb

## 2013-05-15 DIAGNOSIS — M25579 Pain in unspecified ankle and joints of unspecified foot: Secondary | ICD-10-CM

## 2013-05-15 DIAGNOSIS — M25571 Pain in right ankle and joints of right foot: Secondary | ICD-10-CM

## 2013-05-15 DIAGNOSIS — I10 Essential (primary) hypertension: Secondary | ICD-10-CM

## 2013-05-15 DIAGNOSIS — K219 Gastro-esophageal reflux disease without esophagitis: Secondary | ICD-10-CM

## 2013-05-15 NOTE — Assessment & Plan Note (Signed)
Continue with current prescription therapy as reflected on the Med list.  

## 2013-05-15 NOTE — Assessment & Plan Note (Signed)
R ankle - recurrent

## 2013-05-15 NOTE — Progress Notes (Signed)
   Subjective:    HPI   She appears well, in no apparent distress.  Alert and oriented times three, pleasant and cooperative. Vital signs are as documented in vital signs section.  The patient presents for a follow-up of  chronic hypertension, chronic dyslipidemia, GERD, controlled with medicines  C/o an episode of R ankle pain and swelling  BP Readings from Last 3 Encounters:  05/15/13 130/90  11/14/12 140/90  06/24/12 138/100    Wt Readings from Last 3 Encounters:  05/15/13 141 lb (63.957 kg)  11/14/12 142 lb (64.411 kg)  06/24/12 139 lb (63.05 kg)     Review of Systems  Constitutional: Negative for chills, activity change, appetite change, fatigue and unexpected weight change.  HENT: Negative for congestion, mouth sores and sinus pressure.   Eyes: Negative for visual disturbance.  Respiratory: Negative for cough and chest tightness.   Gastrointestinal: Negative for nausea and abdominal pain.  Genitourinary: Negative for frequency, difficulty urinating and vaginal pain.  Musculoskeletal: Negative for back pain and gait problem.  Skin: Negative for pallor and rash.  Neurological: Negative for dizziness, tremors, weakness, numbness and headaches.  Psychiatric/Behavioral: Negative for confusion and sleep disturbance.       Objective:   Physical Exam  Constitutional: She appears well-developed. No distress.  HENT:  Head: Normocephalic.  Right Ear: External ear normal.  Left Ear: External ear normal.  Nose: Nose normal.  Mouth/Throat: Oropharynx is clear and moist.  Eyes: Conjunctivae are normal. Pupils are equal, round, and reactive to light. Right eye exhibits no discharge. Left eye exhibits no discharge.  Neck: Normal range of motion. Neck supple. No JVD present. No tracheal deviation present. No thyromegaly present.  Cardiovascular: Normal rate, regular rhythm and normal heart sounds.   Pulmonary/Chest: No stridor. No respiratory distress. She has no wheezes.   Abdominal: Soft. Bowel sounds are normal. She exhibits no distension and no mass. There is no tenderness. There is no rebound and no guarding.  Musculoskeletal: She exhibits no edema and no tenderness.  Lymphadenopathy:    She has no cervical adenopathy.  Neurological: She displays normal reflexes. No cranial nerve deficit. She exhibits normal muscle tone. Coordination normal.  Skin: No rash noted. No erythema.  Psychiatric: She has a normal mood and affect. Her behavior is normal. Judgment and thought content normal.    Lab Results  Component Value Date   WBC 4.0* 01/24/2011   HGB 13.6 01/24/2011   HCT 40.6 01/24/2011   PLT 236.0 01/24/2011   GLUCOSE 102* 05/14/2013   CHOL 198 11/12/2012   TRIG 89.0 11/12/2012   HDL 64.00 11/12/2012   LDLCALC 116* 11/12/2012   ALT 38* 05/14/2013   AST 37 05/14/2013   NA 138 05/14/2013   K 4.0 05/14/2013   CL 101 05/14/2013   CREATININE 0.6 05/14/2013   BUN 12 05/14/2013   CO2 30 05/14/2013   TSH 0.46 05/14/2013         Assessment & Plan:

## 2013-05-28 ENCOUNTER — Telehealth: Payer: Self-pay | Admitting: *Deleted

## 2013-05-28 DIAGNOSIS — Z0000 Encounter for general adult medical examination without abnormal findings: Secondary | ICD-10-CM

## 2013-05-28 NOTE — Telephone Encounter (Signed)
CPE labs entered.  

## 2013-05-28 NOTE — Telephone Encounter (Signed)
Message copied by Merrilyn Puma on Thu May 28, 2013  9:13 AM ------      Message from: Etheleen Sia      Created: Fri May 15, 2013  1:56 PM      Regarding: LAB       PHYSICAL LABS IN DEC ------

## 2013-06-15 ENCOUNTER — Telehealth: Payer: Self-pay | Admitting: *Deleted

## 2013-06-15 MED ORDER — ZOSTER VACCINE LIVE 19400 UNT/0.65ML ~~LOC~~ SOLR: 0.6500 mL | Freq: Once | SUBCUTANEOUS | Status: DC

## 2013-06-15 NOTE — Telephone Encounter (Signed)
Pt called requesting Rx for Zostervax be called into [facility] at [phone]

## 2013-06-15 NOTE — Telephone Encounter (Signed)
Ok Thx 

## 2013-06-16 ENCOUNTER — Ambulatory Visit (INDEPENDENT_AMBULATORY_CARE_PROVIDER_SITE_OTHER): Payer: BC Managed Care – PPO | Admitting: *Deleted

## 2013-06-16 DIAGNOSIS — Z23 Encounter for immunization: Secondary | ICD-10-CM

## 2013-06-16 DIAGNOSIS — Z2911 Encounter for prophylactic immunotherapy for respiratory syncytial virus (RSV): Secondary | ICD-10-CM

## 2013-06-16 NOTE — Telephone Encounter (Signed)
rx sent to pharmacy

## 2013-10-15 ENCOUNTER — Other Ambulatory Visit: Payer: Self-pay | Admitting: Internal Medicine

## 2013-11-10 ENCOUNTER — Other Ambulatory Visit (INDEPENDENT_AMBULATORY_CARE_PROVIDER_SITE_OTHER): Payer: BC Managed Care – PPO

## 2013-11-10 DIAGNOSIS — Z0000 Encounter for general adult medical examination without abnormal findings: Secondary | ICD-10-CM

## 2013-11-10 LAB — CBC WITH DIFFERENTIAL/PLATELET
Basophils Relative: 0.7 % (ref 0.0–3.0)
Eosinophils Absolute: 0.2 10*3/uL (ref 0.0–0.7)
HCT: 39.6 % (ref 36.0–46.0)
Lymphs Abs: 2.1 10*3/uL (ref 0.7–4.0)
MCHC: 33.4 g/dL (ref 30.0–36.0)
MCV: 81.6 fl (ref 78.0–100.0)
Monocytes Absolute: 0.3 10*3/uL (ref 0.1–1.0)
Monocytes Relative: 7.7 % (ref 3.0–12.0)
Neutro Abs: 1.8 10*3/uL (ref 1.4–7.7)
Neutrophils Relative %: 39.2 % — ABNORMAL LOW (ref 43.0–77.0)
RBC: 4.85 Mil/uL (ref 3.87–5.11)
WBC: 4.5 10*3/uL (ref 4.5–10.5)

## 2013-11-10 LAB — BASIC METABOLIC PANEL
BUN: 11 mg/dL (ref 6–23)
CO2: 31 mEq/L (ref 19–32)
Calcium: 9.7 mg/dL (ref 8.4–10.5)
Chloride: 101 mEq/L (ref 96–112)
GFR: 97.88 mL/min (ref >60.00)
Potassium: 3.7 mEq/L (ref 3.5–5.1)
Sodium: 137 mEq/L (ref 135–145)

## 2013-11-10 LAB — URINALYSIS, ROUTINE W REFLEX MICROSCOPIC
Hgb urine dipstick: NEGATIVE
Ketones, ur: NEGATIVE
Specific Gravity, Urine: 1.01 (ref 1.000–1.030)
Total Protein, Urine: NEGATIVE
Urine Glucose: NEGATIVE
Urobilinogen, UA: 0.2 (ref 0.0–1.0)

## 2013-11-10 LAB — TSH: TSH: 0.79 u[IU]/mL (ref 0.35–5.50)

## 2013-11-10 LAB — HEPATIC FUNCTION PANEL
ALT: 33 U/L (ref 0–35)
AST: 34 U/L (ref 0–37)
Alkaline Phosphatase: 47 U/L (ref 39–117)
Bilirubin, Direct: 0.1 mg/dL (ref 0.0–0.3)
Total Bilirubin: 0.8 mg/dL (ref 0.3–1.2)
Total Protein: 7.3 g/dL (ref 6.0–8.3)

## 2013-11-10 LAB — LIPID PANEL
Cholesterol: 177 mg/dL (ref 0–200)
Total CHOL/HDL Ratio: 4
Triglycerides: 80 mg/dL (ref 0.0–149.0)
VLDL: 16 mg/dL (ref 0.0–40.0)

## 2013-11-11 ENCOUNTER — Encounter: Payer: Self-pay | Admitting: Internal Medicine

## 2013-11-11 ENCOUNTER — Ambulatory Visit (INDEPENDENT_AMBULATORY_CARE_PROVIDER_SITE_OTHER): Payer: BC Managed Care – PPO | Admitting: Internal Medicine

## 2013-11-11 VITALS — BP 130/100 | Temp 97.3°F | Ht 62.5 in | Wt 143.0 lb

## 2013-11-11 DIAGNOSIS — Z0000 Encounter for general adult medical examination without abnormal findings: Secondary | ICD-10-CM

## 2013-11-11 DIAGNOSIS — Z23 Encounter for immunization: Secondary | ICD-10-CM

## 2013-11-11 DIAGNOSIS — M545 Low back pain, unspecified: Secondary | ICD-10-CM

## 2013-11-11 DIAGNOSIS — I10 Essential (primary) hypertension: Secondary | ICD-10-CM

## 2013-11-11 MED ORDER — IBUPROFEN 600 MG PO TABS: 600.0000 mg | ORAL_TABLET | Freq: Three times a day (TID) | ORAL | Status: DC | PRN

## 2013-11-11 NOTE — Assessment & Plan Note (Signed)
Continue with current prescription therapy as reflected on the Med list.  

## 2013-11-11 NOTE — Assessment & Plan Note (Signed)
We discussed age appropriate health related issues, including available/recomended screening tests and vaccinations. We discussed a need for adhering to healthy diet and exercise. Labs/EKG were reviewed/ordered. All questions were answered. She had a PAP, mammo Ophth exam is due Colon due in 2019  

## 2013-11-11 NOTE — Assessment & Plan Note (Signed)
Stretch Ibuprofen prn

## 2013-11-11 NOTE — Progress Notes (Signed)
Pre visit review using our clinic review tool, if applicable. No additional management support is needed unless otherwise documented below in the visit note. 

## 2013-11-27 ENCOUNTER — Other Ambulatory Visit: Payer: Self-pay | Admitting: Internal Medicine

## 2014-02-01 ENCOUNTER — Ambulatory Visit (INDEPENDENT_AMBULATORY_CARE_PROVIDER_SITE_OTHER): Payer: BC Managed Care – PPO | Admitting: Internal Medicine

## 2014-02-01 ENCOUNTER — Encounter: Payer: Self-pay | Admitting: Internal Medicine

## 2014-02-01 VITALS — BP 130/80 | HR 80 | Temp 98.1°F | Resp 16 | Wt 143.0 lb

## 2014-02-01 DIAGNOSIS — J302 Other seasonal allergic rhinitis: Secondary | ICD-10-CM

## 2014-02-01 DIAGNOSIS — J019 Acute sinusitis, unspecified: Secondary | ICD-10-CM

## 2014-02-01 DIAGNOSIS — J309 Allergic rhinitis, unspecified: Secondary | ICD-10-CM

## 2014-02-01 MED ORDER — METHYLPREDNISOLONE ACETATE 80 MG/ML IJ SUSP
80.0000 mg | Freq: Once | INTRAMUSCULAR | Status: AC
  Administered 2014-02-01: 80 mg via INTRAMUSCULAR

## 2014-02-01 MED ORDER — AZITHROMYCIN 250 MG PO TABS: ORAL_TABLET | ORAL | Status: DC

## 2014-02-01 NOTE — Assessment & Plan Note (Signed)
zpack

## 2014-02-01 NOTE — Progress Notes (Signed)
   Subjective:    Sinusitis This is a chronic problem. The problem has been gradually worsening since onset. There has been no fever. The pain is mild. Associated symptoms include headaches, sinus pressure and sneezing. Pertinent negatives include no chills, congestion or coughing. Past treatments include oral decongestants, sitting up and spray decongestants. The treatment provided no relief.     She appears well, in no apparent distress.  Alert and oriented times three, pleasant and cooperative. Vital signs are as documented in vital signs section.  The patient presents for a follow-up of  chronic hypertension, chronic dyslipidemia, GERD, controlled with medicines  C/o an episode of R ankle pain and swelling  BP Readings from Last 3 Encounters:  02/01/14 130/80  11/11/13 130/100  05/15/13 130/90    Wt Readings from Last 3 Encounters:  02/01/14 143 lb (64.864 kg)  11/11/13 143 lb (64.864 kg)  05/15/13 141 lb (63.957 kg)     Review of Systems  Constitutional: Negative for chills, activity change, appetite change, fatigue and unexpected weight change.  HENT: Positive for sinus pressure and sneezing. Negative for congestion and mouth sores.   Eyes: Negative for visual disturbance.  Respiratory: Negative for cough and chest tightness.   Gastrointestinal: Negative for nausea and abdominal pain.  Genitourinary: Negative for frequency, difficulty urinating and vaginal pain.  Musculoskeletal: Negative for back pain and gait problem.  Skin: Negative for pallor and rash.  Neurological: Positive for headaches. Negative for dizziness, tremors, weakness and numbness.  Psychiatric/Behavioral: Negative for confusion and sleep disturbance.       Objective:   Physical Exam  Constitutional: She appears well-developed. No distress.  HENT:  Head: Normocephalic.  Right Ear: External ear normal.  Left Ear: External ear normal.  Nose: Nose normal.  Mouth/Throat: Oropharynx is clear and  moist.  Eyes: Conjunctivae are normal. Pupils are equal, round, and reactive to light. Right eye exhibits no discharge. Left eye exhibits no discharge.  Neck: Normal range of motion. Neck supple. No JVD present. No tracheal deviation present. No thyromegaly present.  Cardiovascular: Normal rate, regular rhythm and normal heart sounds.   Pulmonary/Chest: No stridor. No respiratory distress. She has no wheezes.  Abdominal: Soft. Bowel sounds are normal. She exhibits no distension and no mass. There is no tenderness. There is no rebound and no guarding.  Musculoskeletal: She exhibits no edema and no tenderness.  Lymphadenopathy:    She has no cervical adenopathy.  Neurological: She displays normal reflexes. No cranial nerve deficit. She exhibits normal muscle tone. Coordination normal.  Skin: No rash noted. No erythema.  Psychiatric: She has a normal mood and affect. Her behavior is normal. Judgment and thought content normal.    Lab Results  Component Value Date   WBC 4.5 11/10/2013   HGB 13.2 11/10/2013   HCT 39.6 11/10/2013   PLT 284.0 11/10/2013   GLUCOSE 81 11/10/2013   CHOL 177 11/10/2013   TRIG 80.0 11/10/2013   HDL 44.30 11/10/2013   LDLCALC 117* 11/10/2013   ALT 33 11/10/2013   AST 34 11/10/2013   NA 137 11/10/2013   K 3.7 11/10/2013   CL 101 11/10/2013   CREATININE 0.8 11/10/2013   BUN 11 11/10/2013   CO2 31 11/10/2013   TSH 0.79 11/10/2013         Assessment & Plan:

## 2014-02-01 NOTE — Assessment & Plan Note (Signed)
3/15 worse Depomedrol 80 mg im

## 2014-02-01 NOTE — Progress Notes (Signed)
Pre visit review using our clinic review tool, if applicable. No additional management support is needed unless otherwise documented below in the visit note. 

## 2014-05-25 ENCOUNTER — Other Ambulatory Visit: Payer: Self-pay | Admitting: Internal Medicine

## 2014-11-01 LAB — HM PAP SMEAR

## 2014-11-01 LAB — HM MAMMOGRAPHY

## 2014-11-12 ENCOUNTER — Ambulatory Visit (INDEPENDENT_AMBULATORY_CARE_PROVIDER_SITE_OTHER): Payer: BC Managed Care – PPO | Admitting: Internal Medicine

## 2014-11-12 ENCOUNTER — Other Ambulatory Visit (INDEPENDENT_AMBULATORY_CARE_PROVIDER_SITE_OTHER): Payer: BC Managed Care – PPO

## 2014-11-12 ENCOUNTER — Other Ambulatory Visit: Payer: Self-pay | Admitting: Internal Medicine

## 2014-11-12 ENCOUNTER — Encounter: Payer: Self-pay | Admitting: Internal Medicine

## 2014-11-12 VITALS — BP 130/90 | HR 97 | Temp 97.7°F | Ht 61.0 in | Wt 142.0 lb

## 2014-11-12 DIAGNOSIS — Z23 Encounter for immunization: Secondary | ICD-10-CM

## 2014-11-12 DIAGNOSIS — Z0000 Encounter for general adult medical examination without abnormal findings: Secondary | ICD-10-CM

## 2014-11-12 DIAGNOSIS — R7989 Other specified abnormal findings of blood chemistry: Secondary | ICD-10-CM

## 2014-11-12 DIAGNOSIS — I10 Essential (primary) hypertension: Secondary | ICD-10-CM

## 2014-11-12 DIAGNOSIS — R945 Abnormal results of liver function studies: Secondary | ICD-10-CM

## 2014-11-12 LAB — URINALYSIS
Bilirubin Urine: NEGATIVE
Hgb urine dipstick: NEGATIVE
Ketones, ur: NEGATIVE
Leukocytes, UA: NEGATIVE
NITRITE: NEGATIVE
PH: 7.5 (ref 5.0–8.0)
Specific Gravity, Urine: <=1.005 — AB (ref 1.000–1.030)
Total Protein, Urine: NEGATIVE
URINE GLUCOSE: NEGATIVE
Urobilinogen, UA: 0.2 (ref 0.0–1.0)

## 2014-11-12 LAB — LIPID PANEL
Cholesterol: 206 mg/dL — ABNORMAL HIGH (ref 0–200)
HDL: 47.4 mg/dL (ref >39.00)
LDL Cholesterol: 129 mg/dL — ABNORMAL HIGH (ref 0–99)
NonHDL: 158.6
TRIGLYCERIDES: 146 mg/dL (ref 0.0–149.0)
Total CHOL/HDL Ratio: 4
VLDL: 29.2 mg/dL (ref 0.0–40.0)

## 2014-11-12 LAB — HEPATIC FUNCTION PANEL
ALK PHOS: 48 U/L (ref 39–117)
ALT: 37 U/L — AB (ref 0–35)
AST: 40 U/L — ABNORMAL HIGH (ref 0–37)
Albumin: 4.5 g/dL (ref 3.5–5.2)
BILIRUBIN TOTAL: 0.8 mg/dL (ref 0.2–1.2)
Bilirubin, Direct: 0.1 mg/dL (ref 0.0–0.3)
TOTAL PROTEIN: 8.1 g/dL (ref 6.0–8.3)

## 2014-11-12 LAB — CBC WITH DIFFERENTIAL/PLATELET
BASOS ABS: 0 10*3/uL (ref 0.0–0.1)
Basophils Relative: 0.6 % (ref 0.0–3.0)
Eosinophils Absolute: 0.1 10*3/uL (ref 0.0–0.7)
Eosinophils Relative: 2 % (ref 0.0–5.0)
HEMATOCRIT: 42.7 % (ref 36.0–46.0)
Hemoglobin: 13.9 g/dL (ref 12.0–15.0)
LYMPHS ABS: 2.9 10*3/uL (ref 0.7–4.0)
LYMPHS PCT: 47.7 % — AB (ref 12.0–46.0)
MCHC: 32.5 g/dL (ref 30.0–36.0)
MCV: 82.7 fl (ref 78.0–100.0)
MONOS PCT: 4.8 % (ref 3.0–12.0)
Monocytes Absolute: 0.3 10*3/uL (ref 0.1–1.0)
NEUTROS PCT: 44.9 % (ref 43.0–77.0)
Neutro Abs: 2.7 10*3/uL (ref 1.4–7.7)
PLATELETS: 298 10*3/uL (ref 150.0–400.0)
RBC: 5.17 Mil/uL — ABNORMAL HIGH (ref 3.87–5.11)
RDW: 12.7 % (ref 11.5–15.5)
WBC: 6 10*3/uL (ref 4.0–10.5)

## 2014-11-12 LAB — BASIC METABOLIC PANEL
BUN: 10 mg/dL (ref 6–23)
CALCIUM: 10.3 mg/dL (ref 8.4–10.5)
CO2: 29 mEq/L (ref 19–32)
Chloride: 98 mEq/L (ref 96–112)
Creatinine, Ser: 0.6 mg/dL (ref 0.4–1.2)
GFR: 125.27 mL/min (ref >60.00)
Glucose, Bld: 80 mg/dL (ref 70–99)
Potassium: 3.4 mEq/L — ABNORMAL LOW (ref 3.5–5.1)
Sodium: 136 mEq/L (ref 135–145)

## 2014-11-12 MED ORDER — FLUTICASONE PROPIONATE 50 MCG/ACT NA SUSP: NASAL | Status: DC

## 2014-11-12 MED ORDER — NEOMYCIN-POLYMYXIN-HC 3.5-10000-1 OT SOLN: OTIC | Status: DC

## 2014-11-12 NOTE — Assessment & Plan Note (Signed)
Continue with current prescription therapy as reflected on the Med list.  

## 2014-11-12 NOTE — Progress Notes (Signed)
Pre visit review using our clinic review tool, if applicable. No additional management support is needed unless otherwise documented below in the visit note. 

## 2014-11-12 NOTE — Assessment & Plan Note (Signed)
We discussed age appropriate health related issues, including available/recomended screening tests and vaccinations. We discussed a need for adhering to healthy diet and exercise. Labs/EKG were reviewed/ordered. All questions were answered. She had a PAP, mammo Ophth exam is due Colon due in 2019

## 2014-11-12 NOTE — Patient Instructions (Signed)
Gluten free trial (no wheat products) for 4-6 weeks. OK to use gluten-free bread and gluten-free pasta.  Milk free trial (no milk, ice cream, cheese and yogurt) for 4-6 weeks. OK to use almond, coconut, rice or soy milk. "Almond breeze" brand tastes good.  

## 2014-11-12 NOTE — Progress Notes (Signed)
   Subjective:    HPI  The patient is here for a wellness exam. The patient has been doing well overall without major physical or psychological issues going on lately.  She appears well, in no apparent distress.  Alert and oriented times three, pleasant and cooperative. Vital signs are as documented in vital signs section.  The patient presents for a follow-up of  chronic hypertension, chronic dyslipidemia, GERD, controlled with medicines  BP Readings from Last 3 Encounters:  11/12/14 130/90  02/01/14 130/80  11/11/13 130/100    Wt Readings from Last 3 Encounters:  11/12/14 142 lb (64.411 kg)  02/01/14 143 lb (64.864 kg)  11/11/13 143 lb (64.864 kg)     Review of Systems  Constitutional: Negative for chills, activity change, appetite change, fatigue and unexpected weight change.  HENT: Negative for congestion, mouth sores and sinus pressure.   Eyes: Negative for visual disturbance.  Respiratory: Negative for cough and chest tightness.   Gastrointestinal: Negative for nausea and abdominal pain.  Genitourinary: Negative for frequency, difficulty urinating and vaginal pain.  Musculoskeletal: Negative for back pain and gait problem.  Skin: Negative for pallor and rash.  Neurological: Negative for dizziness, tremors, weakness, numbness and headaches.  Psychiatric/Behavioral: Negative for confusion and sleep disturbance.       Objective:   Physical Exam  Constitutional: She appears well-developed. No distress.  HENT:  Head: Normocephalic.  Right Ear: External ear normal.  Left Ear: External ear normal.  Nose: Nose normal.  Mouth/Throat: Oropharynx is clear and moist.  Eyes: Conjunctivae are normal. Pupils are equal, round, and reactive to light. Right eye exhibits no discharge. Left eye exhibits no discharge.  Neck: Normal range of motion. Neck supple. No JVD present. No tracheal deviation present. No thyromegaly present.  Cardiovascular: Normal rate, regular rhythm and  normal heart sounds.   Pulmonary/Chest: No stridor. No respiratory distress. She has no wheezes.  Abdominal: Soft. Bowel sounds are normal. She exhibits no distension and no mass. There is no tenderness. There is no rebound and no guarding.  Musculoskeletal: She exhibits no edema or tenderness.  Lymphadenopathy:    She has no cervical adenopathy.  Neurological: She displays normal reflexes. No cranial nerve deficit. She exhibits normal muscle tone. Coordination normal.  Skin: No rash noted. No erythema.  Psychiatric: She has a normal mood and affect. Her behavior is normal. Judgment and thought content normal.    Lab Results  Component Value Date   WBC 4.5 11/10/2013   HGB 13.2 11/10/2013   HCT 39.6 11/10/2013   PLT 284.0 11/10/2013   GLUCOSE 81 11/10/2013   CHOL 177 11/10/2013   TRIG 80.0 11/10/2013   HDL 44.30 11/10/2013   LDLCALC 117* 11/10/2013   ALT 33 11/10/2013   AST 34 11/10/2013   NA 137 11/10/2013   K 3.7 11/10/2013   CL 101 11/10/2013   CREATININE 0.8 11/10/2013   BUN 11 11/10/2013   CO2 31 11/10/2013   TSH 0.79 11/10/2013         Assessment & Plan:

## 2014-11-15 ENCOUNTER — Telehealth: Payer: Self-pay | Admitting: Internal Medicine

## 2014-11-15 NOTE — Telephone Encounter (Signed)
emmi emailed °

## 2014-11-16 DIAGNOSIS — Z23 Encounter for immunization: Secondary | ICD-10-CM

## 2014-11-16 LAB — TSH: TSH: 0.218 u[IU]/mL — ABNORMAL LOW (ref 0.350–4.500)

## 2014-11-17 ENCOUNTER — Other Ambulatory Visit: Payer: Self-pay | Admitting: Internal Medicine

## 2014-11-17 DIAGNOSIS — R945 Abnormal results of liver function studies: Principal | ICD-10-CM

## 2014-11-17 DIAGNOSIS — R7989 Other specified abnormal findings of blood chemistry: Secondary | ICD-10-CM

## 2014-11-17 NOTE — Assessment & Plan Note (Signed)
?  etiology Low carb diet LFTs and HepB, HepC serology in 3 months

## 2015-01-05 ENCOUNTER — Other Ambulatory Visit: Payer: Self-pay | Admitting: Internal Medicine

## 2015-02-16 ENCOUNTER — Encounter: Payer: Self-pay | Admitting: Internal Medicine

## 2015-02-16 ENCOUNTER — Other Ambulatory Visit (INDEPENDENT_AMBULATORY_CARE_PROVIDER_SITE_OTHER): Payer: BLUE CROSS/BLUE SHIELD

## 2015-02-16 ENCOUNTER — Ambulatory Visit (INDEPENDENT_AMBULATORY_CARE_PROVIDER_SITE_OTHER): Payer: BLUE CROSS/BLUE SHIELD | Admitting: Internal Medicine

## 2015-02-16 VITALS — BP 148/90 | HR 75 | Wt 143.0 lb

## 2015-02-16 DIAGNOSIS — J0101 Acute recurrent maxillary sinusitis: Secondary | ICD-10-CM

## 2015-02-16 DIAGNOSIS — R945 Abnormal results of liver function studies: Principal | ICD-10-CM

## 2015-02-16 DIAGNOSIS — R7989 Other specified abnormal findings of blood chemistry: Secondary | ICD-10-CM

## 2015-02-16 DIAGNOSIS — R350 Frequency of micturition: Secondary | ICD-10-CM | POA: Insufficient documentation

## 2015-02-16 DIAGNOSIS — I10 Essential (primary) hypertension: Secondary | ICD-10-CM

## 2015-02-16 DIAGNOSIS — B182 Chronic viral hepatitis C: Secondary | ICD-10-CM

## 2015-02-16 LAB — HEPATIC FUNCTION PANEL
ALBUMIN: 4.5 g/dL (ref 3.5–5.2)
ALT: 28 U/L (ref 0–35)
AST: 31 U/L (ref 0–37)
Alkaline Phosphatase: 50 U/L (ref 39–117)
Bilirubin, Direct: 0.1 mg/dL (ref 0.0–0.3)
TOTAL PROTEIN: 8.2 g/dL (ref 6.0–8.3)
Total Bilirubin: 0.5 mg/dL (ref 0.2–1.2)

## 2015-02-16 LAB — TSH: TSH: 0.41 u[IU]/mL (ref 0.35–4.50)

## 2015-02-16 LAB — IBC PANEL
Iron: 106 ug/dL (ref 42–145)
Saturation Ratios: 26.5 % (ref 20.0–50.0)
Transferrin: 286 mg/dL (ref 212.0–360.0)

## 2015-02-16 LAB — URINALYSIS
Bilirubin Urine: NEGATIVE
Hgb urine dipstick: NEGATIVE
Ketones, ur: NEGATIVE
Leukocytes, UA: NEGATIVE
NITRITE: NEGATIVE
Total Protein, Urine: NEGATIVE
URINE GLUCOSE: NEGATIVE
Urobilinogen, UA: 0.2 (ref 0.0–1.0)
pH: 7 (ref 5.0–8.0)

## 2015-02-16 LAB — BASIC METABOLIC PANEL
BUN: 11 mg/dL (ref 6–23)
CALCIUM: 10.3 mg/dL (ref 8.4–10.5)
CO2: 32 meq/L (ref 19–32)
Chloride: 99 mEq/L (ref 96–112)
Creatinine, Ser: 0.76 mg/dL (ref 0.40–1.20)
GFR: 98.95 mL/min (ref >60.00)
GLUCOSE: 93 mg/dL (ref 70–99)
POTASSIUM: 3.6 meq/L (ref 3.5–5.1)
SODIUM: 136 meq/L (ref 135–145)

## 2015-02-16 LAB — GLUCOSE, POCT (MANUAL RESULT ENTRY): POC GLUCOSE: 88 mg/dL (ref 70–99)

## 2015-02-16 LAB — T4, FREE: Free T4: 0.89 ng/dL (ref 0.60–1.60)

## 2015-02-16 MED ORDER — AZITHROMYCIN 250 MG PO TABS: ORAL_TABLET | ORAL | Status: DC

## 2015-02-16 NOTE — Assessment & Plan Note (Signed)
LFTs 

## 2015-02-16 NOTE — Progress Notes (Signed)
Pre visit review using our clinic review tool, if applicable. No additional management support is needed unless otherwise documented below in the visit note. 

## 2015-02-16 NOTE — Assessment & Plan Note (Signed)
Maxzide

## 2015-02-16 NOTE — Assessment & Plan Note (Signed)
3/16 - ?water intake related CBG  UA

## 2015-02-16 NOTE — Progress Notes (Signed)
   Subjective:    Sinusitis This is a new problem. The current episode started in the past 7 days. There has been no fever. Her pain is at a severity of 2/10. The pain is mild. Associated symptoms include congestion, headaches, sinus pressure and sneezing. Pertinent negatives include no chills, coughing or sore throat.      The patient presents for a follow-up of  chronic hypertension, chronic dyslipidemia, GERD, controlled with medicines  C/o urinary frequency x2 wks. Drinking a lot  BP Readings from Last 3 Encounters:  02/16/15 148/90  11/12/14 130/90  02/01/14 130/80    Wt Readings from Last 3 Encounters:  02/16/15 143 lb (64.864 kg)  11/12/14 142 lb (64.411 kg)  02/01/14 143 lb (64.864 kg)     Review of Systems  Constitutional: Negative for chills, activity change, appetite change, fatigue and unexpected weight change.  HENT: Positive for congestion, sinus pressure and sneezing. Negative for mouth sores and sore throat.   Eyes: Negative for visual disturbance.  Respiratory: Negative for cough and chest tightness.   Gastrointestinal: Negative for nausea and abdominal pain.  Genitourinary: Negative for frequency, difficulty urinating and vaginal pain.  Musculoskeletal: Negative for back pain and gait problem.  Skin: Negative for pallor and rash.  Neurological: Positive for headaches. Negative for dizziness, tremors, weakness and numbness.  Psychiatric/Behavioral: Negative for confusion and sleep disturbance.       Objective:   Physical Exam  Constitutional: She appears well-developed. No distress.  HENT:  Head: Normocephalic.  Right Ear: External ear normal.  Left Ear: External ear normal.  Nose: Nose normal.  Mouth/Throat: Oropharynx is clear and moist.  Eyes: Conjunctivae are normal. Pupils are equal, round, and reactive to light. Right eye exhibits no discharge. Left eye exhibits no discharge.  Neck: Normal range of motion. Neck supple. No JVD present. No  tracheal deviation present. No thyromegaly present.  Cardiovascular: Normal rate, regular rhythm and normal heart sounds.   Pulmonary/Chest: No stridor. No respiratory distress. She has no wheezes.  Abdominal: Soft. Bowel sounds are normal. She exhibits no distension and no mass. There is no tenderness. There is no rebound and no guarding.  Musculoskeletal: She exhibits no edema or tenderness.  Lymphadenopathy:    She has no cervical adenopathy.  Neurological: She displays normal reflexes. No cranial nerve deficit. She exhibits normal muscle tone. Coordination normal.  Skin: No rash noted. No erythema.  Psychiatric: She has a normal mood and affect. Her behavior is normal. Judgment and thought content normal.  R nostril mucosa - swollen w/pus-like d/c  Lab Results  Component Value Date   WBC 6.0 11/12/2014   HGB 13.9 11/12/2014   HCT 42.7 11/12/2014   PLT 298.0 11/12/2014   GLUCOSE 80 11/12/2014   CHOL 206* 11/12/2014   TRIG 146.0 11/12/2014   HDL 47.40 11/12/2014   LDLCALC 129* 11/12/2014   ALT 37* 11/12/2014   AST 40* 11/12/2014   NA 136 11/12/2014   K 3.4* 11/12/2014   CL 98 11/12/2014   CREATININE 0.6 11/12/2014   BUN 10 11/12/2014   CO2 29 11/12/2014   TSH 0.218* 11/12/2014         Assessment & Plan:

## 2015-02-16 NOTE — Assessment & Plan Note (Signed)
3/16 - R max Zpac

## 2015-02-16 NOTE — Addendum Note (Signed)
Addended by: Merrilyn PumaSIMMONS, Monica Codd N on: 02/16/2015 03:58 PM   Modules accepted: Orders

## 2015-02-17 LAB — HEPATITIS C ANTIBODY: HCV Ab: REACTIVE — AB

## 2015-02-17 LAB — HEPATITIS B SURFACE ANTIBODY,QUALITATIVE: Hep B S Ab: NEGATIVE

## 2015-02-17 LAB — HEPATITIS B SURFACE ANTIGEN: HEP B S AG: NEGATIVE

## 2015-02-18 LAB — HEPATITIS C RNA QUANTITATIVE
HCV QUANT: 823397 [IU]/mL — AB (ref <15)
HCV Quantitative Log: 5.92 {Log} — ABNORMAL HIGH (ref <1.18)

## 2015-02-19 DIAGNOSIS — B182 Chronic viral hepatitis C: Secondary | ICD-10-CM | POA: Insufficient documentation

## 2015-02-19 NOTE — Addendum Note (Signed)
Addended by: Tresa GarterPLOTNIKOV, Gaby Harney V on: 02/19/2015 06:24 PM   Modules accepted: Orders

## 2015-02-19 NOTE — Assessment & Plan Note (Signed)
New. Will ref to [facility]atitis [facility]

## 2015-03-14 ENCOUNTER — Telehealth: Payer: Self-pay | Admitting: Internal Medicine

## 2015-03-14 NOTE — Telephone Encounter (Signed)
Patient is calling regarding referral dated 02/16/2015. It appears that the other office is in possession of the referral, but their note is not descriptive and it has been outstranding. The patient is trying to figure out who to contact. Please call the patient to advise on who she should call.

## 2015-03-15 ENCOUNTER — Other Ambulatory Visit: Payer: BLUE CROSS/BLUE SHIELD

## 2015-03-15 NOTE — Telephone Encounter (Signed)
[facility] will call pt. Pt is aware.

## 2015-03-16 ENCOUNTER — Other Ambulatory Visit: Payer: BLUE CROSS/BLUE SHIELD

## 2015-03-16 DIAGNOSIS — B182 Chronic viral hepatitis C: Secondary | ICD-10-CM

## 2015-03-18 LAB — PROTIME-INR

## 2015-03-18 LAB — HIV ANTIBODY (ROUTINE TESTING W REFLEX)

## 2015-03-18 LAB — COMPREHENSIVE METABOLIC PANEL

## 2015-03-18 LAB — HEPATITIS A ANTIBODY, TOTAL

## 2015-03-18 LAB — HEPATITIS C GENOTYPE

## 2015-03-18 LAB — ANA

## 2015-03-18 LAB — IRON

## 2015-03-21 ENCOUNTER — Other Ambulatory Visit: Payer: BLUE CROSS/BLUE SHIELD

## 2015-03-21 DIAGNOSIS — B1920 Unspecified viral hepatitis C without hepatic coma: Secondary | ICD-10-CM

## 2015-03-21 LAB — CBC WITH DIFFERENTIAL/PLATELET
Basophils Absolute: 0 10*3/uL (ref 0.0–0.1)
Basophils Relative: 0 % (ref 0–1)
EOS ABS: 0.2 10*3/uL (ref 0.0–0.7)
Eosinophils Relative: 4 % (ref 0–5)
HEMATOCRIT: 40.5 % (ref 36.0–46.0)
Hemoglobin: 13.4 g/dL (ref 12.0–15.0)
LYMPHS ABS: 3.2 10*3/uL (ref 0.7–4.0)
Lymphocytes Relative: 57 % — ABNORMAL HIGH (ref 12–46)
MCH: 26.7 pg (ref 26.0–34.0)
MCHC: 33.1 g/dL (ref 30.0–36.0)
MCV: 80.7 fL (ref 78.0–100.0)
MPV: 9 fL (ref 8.6–12.4)
Monocytes Absolute: 0.4 10*3/uL (ref 0.1–1.0)
Monocytes Relative: 8 % (ref 3–12)
NEUTROS ABS: 1.7 10*3/uL (ref 1.7–7.7)
NEUTROS PCT: 31 % — AB (ref 43–77)
Platelets: 268 10*3/uL (ref 150–400)
RBC: 5.02 MIL/uL (ref 3.87–5.11)
RDW: 12.9 % (ref 11.5–15.5)
WBC: 5.6 10*3/uL (ref 4.0–10.5)

## 2015-03-21 LAB — COMPREHENSIVE METABOLIC PANEL
ALT: 41 U/L — ABNORMAL HIGH (ref 0–35)
AST: 38 U/L — ABNORMAL HIGH (ref 0–37)
Albumin: 4.3 g/dL (ref 3.5–5.2)
Alkaline Phosphatase: 46 U/L (ref 39–117)
BUN: 15 mg/dL (ref 6–23)
CO2: 30 mEq/L (ref 19–32)
Calcium: 9.8 mg/dL (ref 8.4–10.5)
Chloride: 97 mEq/L (ref 96–112)
Creat: 0.74 mg/dL (ref 0.50–1.10)
Glucose, Bld: 93 mg/dL (ref 70–99)
Potassium: 4 mEq/L (ref 3.5–5.3)
Sodium: 134 mEq/L — ABNORMAL LOW (ref 135–145)
Total Bilirubin: 0.4 mg/dL (ref 0.2–1.2)
Total Protein: 7.4 g/dL (ref 6.0–8.3)

## 2015-03-21 LAB — IRON: IRON: 88 ug/dL (ref 42–145)

## 2015-03-22 LAB — HIV ANTIBODY (ROUTINE TESTING W REFLEX): HIV: NONREACTIVE

## 2015-03-22 LAB — PROTIME-INR
INR: 1.12 (ref <1.50)
Prothrombin Time: 14.4 seconds (ref 11.6–15.2)

## 2015-03-22 LAB — ANA: Anti Nuclear Antibody(ANA): NEGATIVE

## 2015-03-22 LAB — HEPATITIS A ANTIBODY, TOTAL: HEP A TOTAL AB: REACTIVE — AB

## 2015-03-24 LAB — HEPATITIS C GENOTYPE

## 2015-04-04 ENCOUNTER — Encounter: Payer: Self-pay | Admitting: Internal Medicine

## 2015-04-04 ENCOUNTER — Ambulatory Visit (INDEPENDENT_AMBULATORY_CARE_PROVIDER_SITE_OTHER): Payer: BLUE CROSS/BLUE SHIELD | Admitting: Internal Medicine

## 2015-04-04 VITALS — BP 147/90 | HR 96 | Temp 97.6°F | Ht 62.0 in | Wt 146.0 lb

## 2015-04-04 DIAGNOSIS — B182 Chronic viral hepatitis C: Secondary | ICD-10-CM | POA: Diagnosis not present

## 2015-04-04 DIAGNOSIS — Z23 Encounter for immunization: Secondary | ICD-10-CM

## 2015-04-04 MED ORDER — LEDIPASVIR-SOFOSBUVIR 90-400 MG PO TABS: 1.0000 | ORAL_TABLET | Freq: Every day | ORAL | Status: DC

## 2015-04-04 NOTE — Progress Notes (Signed)
+Dana Long is a 64 y.o. female who presents for initial evaluation and management of a positive Hepatitis C antibody test.  Patient tested positive many years ago and recently retested with elevated LFTs. Hepatitis C risk factors present are: none. Patient denies accidental needle stick, history of blood transfusion, history of clotting factor transfusion, intranasal drug use, IV drug abuse, multiple sexual partners, renal dialysis, sexual contact with person with liver disease, tattoos. Patient has had other studies performed. Results: hepatitis C RNA by PCR, result: positive. Patient has not had prior treatment for Hepatitis C. Patient does not have a past history of liver disease. Patient does not have a family history of liver disease.   HPI: She has mild transaminitis that prompted testing. Does not recall any exposure history.  No signs of liver decompensation.  Never treated.   Patient does have documented immunity to Hepatitis A. Patient does not have documented immunity to Hepatitis B.     Review of Systems A comprehensive review of systems was negative.   Past Medical History  Diagnosis Date  . GERD (gastroesophageal reflux disease)   . Hypertension   . Gout     ?    Prior to Admission medications   Medication Sig Start Date End Date Taking? Authorizing Provider  Calcium-Phosphorus-Vitamin D (CITRACAL +D3 PO) Take 1 each by mouth daily.   Yes Historical Provider, MD  fluticasone (FLONASE) 50 MCG/ACT nasal spray USE 2 SPRAYS INTO THE NOSE EVERY DAY 11/12/14  Yes Aleksei Plotnikov V, MD  ibuprofen (ADVIL,MOTRIN) 600 MG tablet Take 1 tablet (600 mg total) by mouth every 8 (eight) hours as needed. 11/11/13  Yes Aleksei Plotnikov V, MD  neomycin-polymyxin-hydrocortisone (CORTISPORIN) otic solution PLACE 3 DROPS IN RIGHT EAR 3 TIMES A DAY 11/12/14  Yes Aleksei Plotnikov V, MD  triamterene-hydrochlorothiazide (MAXZIDE-25) 37.5-25 MG per tablet TAKE 1 TABLET BY MOUTH ONCE A DAY  01/05/15  Yes Aleksei Plotnikov V, MD  cholecalciferol (VITAMIN D) 1000 UNITS tablet Take 1,000 Units by mouth daily. 07/24/11 11/14/12  Aleksei Plotnikov V, MD  Ledipasvir-Sofosbuvir (HARVONI) 90-400 MG TABS Take 1 tablet by mouth daily. 04/04/15   Gardiner Barefootobert W Ivan Lacher, MD  loratadine (CLARITIN) 10 MG tablet Take 1 tablet (10 mg total) by mouth daily. 06/24/12 08/24/13  Aleksei Plotnikov V, MD    No Known Allergies  History  Substance Use Topics  . Smoking status: Former Games developermoker  . Smokeless tobacco: Never Used  . Alcohol Use: No    Family History  Problem Relation Age of Onset  . Gout Father   . Diabetes Other   . Hypertension Other   . Diabetes Mother   . Diabetes Sister   . Diabetes Brother       Objective:   Filed Vitals:   04/04/15 1100  BP: 147/90  Pulse: 96  Temp: 97.6 F (36.4 C)   in no apparent distress and alert HEENT: anicteric Cor RRR and No murmurs clear Bowel sounds are normal, liver is not enlarged, spleen is not enlarged peripheral pulses normal, no pedal edema, no clubbing or cyanosis negative for - jaundice, spider hemangioma, telangiectasia, palmar erythema, ecchymosis and atrophy  Laboratory Genotype:  Lab Results  Component Value Date   HCVGENOTYPE 1a 03/21/2015   HCV viral load:  Lab Results  Component Value Date   HCVQUANT [id number]* 02/16/2015   Lab Results  Component Value Date   WBC 5.6 03/21/2015   HGB 13.4 03/21/2015   HCT 40.5 03/21/2015   MCV 80.7 03/21/2015  PLT 268 03/21/2015    Lab Results  Component Value Date   CREATININE 0.74 03/21/2015   BUN 15 03/21/2015   NA 134* 03/21/2015   K 4.0 03/21/2015   CL 97 03/21/2015   CO2 30 03/21/2015    Lab Results  Component Value Date   ALT 41* 03/21/2015   AST 38* 03/21/2015   ALKPHOS 46 03/21/2015   BILITOT 0.4 03/21/2015   INR 1.12 03/21/2015      Assessment: Chronic Hepatitis C genotype 1a  Plan: 1) Patient counseled extensively on limiting acetaminophen to no more than 2  grams daily, avoidance of alcohol. 2) Transmission discussed with patient including sexual transmission, sharing razors and toothbrush.   3) Will need referral to gastroenterology if concern for cirrhosis 4) Will need referral for substance abuse counseling: No. 5) Will prescribe Harvoni for 12 weeks now 6) Hepatitis A vaccine No. 7) Hepatitis B vaccine Yes.   8) Pneumovax vaccine if concern for cirrhosis 9) will follow up after elastography

## 2015-04-04 NOTE — Patient Instructions (Signed)
Date 04/04/2015  Dear Mrs Darcella GasmanDodson, As discussed in the ID Clinic, your hepatitis C therapy will include the following medications:          Harvoni 90mg /400mg  tablet:           Take 1 tablet by mouth once daily   Please note that ALL MEDICATIONS WILL START ON THE SAME DATE for a total of 12 weeks. ---------------------------------------------------------------- Your HCV Treatment Start Date: TBA   Your HCV genotype:  1a    Liver Fibrosis: TBD    ---------------------------------------------------------------- YOUR PHARMACY CONTACT:   [facility] [address] Phone: [phone] Hours: Monday to Friday 7:30 am to 6:00 pm   Please always contact your pharmacy at least 3-4 business days before you run out of medications to ensure your next month's medication is ready or 1 week prior to running out if you receive it by mail.  Remember, each prescription is for 28 days. ---------------------------------------------------------------- GENERAL NOTES REGARDING YOUR HEPATITIS C MEDICATION:  SOFOSBUVIR/LEDIPASVIR (HARVONI): - Harvoni tablet is taken daily with OR without food. - The tablets are orange. - The tablets should be stored at room temperature.  - Acid reducing agents such as H2 blockers (ie. Pepcid (famotidine), Zantac (ranitidine), Tagamet (cimetidine), Axid (nizatidine) and proton pump inhibitors (ie. Prilosec (omeprazole), Protonix (pantoprazole), Nexium (esomeprazole), or Aciphex (rabeprazole)) can decrease effectiveness of Harvoni. Do not take until you have discussed with a health care provider.    -Antacids that contain magnesium and/or aluminum hydroxide (ie. Milk of Magensia, Rolaids, Gaviscon, Maalox, Mylanta, an dArthritis Pain Formula)can reduce absorption of Harvoni, so take them at least 4 hours before or after Harvoni.  -Calcium carbonate (calcium supplements or antacids such as Tums, Caltrate,  Os-Cal)needs to be taken at least 4 hours hours before or after Harvoni.  -St. John's wort or any products that contain St. John's wort like some herbal supplements  Please inform the office prior to starting any of these medications.  - The common side effects with Harvoni:      1. Fatigue      2. Headache      3. Nausea      4. Diarrhea      5. Insomnia   Support Path is a suite of resources designed to help patients start with HARVONI and move toward treatment completion GETTING STARTED Support Path helps patients access therapy and get off to an efficient start  Benefits investigation and prior authorization support Co-pay and other financial assistance A specialty pharmacy finder CO-PAY COUPON The HARVONI co-pay coupon may help eligible patients lower their out-of-pocket costs. With a co-pay coupon, most eligible patients may pay no more than $5 per co-pay (restrictions apply) www.harvoni.com call 814-185-72271-680-273-7131 Not valid for patients enrolled in government healthcare prescription drug programs, such as Medicare Part D and Medicaid. Patients in the coverage gap known as the "donut hole" also are not eligible The HARVONI co-pay coupon program will cover the out-of-pocket costs for HARVONI prescriptions up to a maximum of 25% of the catalog price of a 12-week regimen of HARVONI  Please note that this only lists the most common side effects and is NOT a comprehensive list of the potential side effects of these medications. For more information, please review the drug information sheets that come with your medication package from the pharmacy.  ---------------------------------------------------------------- GENERAL HELPFUL HINTS ON HCV THERAPY: 1. No alcohol. 2. Protect against sun-sensitivity/sunburns (wear sunglasses, hat, long sleeves, pants  and sunscreen). 3. Stay well-hydrated/well-moisturized. 4. Notify the ID Clinic of any changes in your other over-the-counter/herbal or  prescription medications. 5. If you miss a dose of your medication, take the missed dose as soon as you remember. Return to your regular time/dose schedule the next day.  6.  Do not stop taking your medications without first talking with your healthcare provider. 7.  You may take Tylenol (acetaminophen), as long as the dose is less than 2000 mg (OR no more than 4 tablets of the Tylenol Extra Strengths 500mg  tablet) in 24 hours. 8.  You will need to obtain routine labs and/or office visits at [facility] at weeks 2 and 12 as well as 12 and 24 weeks after completion of treatment.   Staci RighterOMER, Bessie Livingood, MD  [facility] [address] [place], [place]  [id number] [phone]

## 2015-04-28 ENCOUNTER — Ambulatory Visit (HOSPITAL_COMMUNITY)
Admission: RE | Admit: 2015-04-28 | Discharge: 2015-04-28 | Disposition: A | Discharge status: Home / Self Care | Payer: BLUE CROSS/BLUE SHIELD | Source: Ambulatory Visit | Attending: Internal Medicine | Admitting: Internal Medicine

## 2015-04-28 DIAGNOSIS — B1920 Unspecified viral hepatitis C without hepatic coma: Secondary | ICD-10-CM | POA: Diagnosis present

## 2015-04-28 DIAGNOSIS — B182 Chronic viral hepatitis C: Secondary | ICD-10-CM

## 2015-05-02 ENCOUNTER — Encounter: Payer: Self-pay | Admitting: Internal Medicine

## 2015-05-02 ENCOUNTER — Ambulatory Visit (INDEPENDENT_AMBULATORY_CARE_PROVIDER_SITE_OTHER): Payer: BLUE CROSS/BLUE SHIELD | Admitting: Internal Medicine

## 2015-05-02 VITALS — BP 149/95 | HR 86 | Temp 98.1°F | Ht 61.0 in | Wt 147.0 lb

## 2015-05-02 DIAGNOSIS — Z23 Encounter for immunization: Secondary | ICD-10-CM

## 2015-05-02 DIAGNOSIS — B182 Chronic viral hepatitis C: Secondary | ICD-10-CM

## 2015-05-02 NOTE — Progress Notes (Signed)
Patient ID: Dana KernRegenia Long, female   DOB: 08/12/1951, 64 y.o.   MRN: [id number]  HPI: Dana Long is a 64 y.o. female with Hep C presenting to clinic for follow up after US elastography and lab work.  Lab Results  Component Value Date   HCVGENOTYPE 1a 03/21/2015    Allergies: No Known Allergies  Vitals: Temp: 98.1 F (36.7 C) (06/06 1510) Temp Source: Oral (06/06 1510) BP: 149/95 mmHg (06/06 1510) Pulse Rate: 86 (06/06 1510)  Past Medical History: Past Medical History  Diagnosis Date  . GERD (gastroesophageal reflux disease)   . Hypertension   . Gout     ?    Social History: History   Social History  . Marital Status: Single    Spouse Name: N/A  . Number of Children: N/A  . Years of Education: N/A   Social History Main Topics  . Smoking status: Former Games developermoker  . Smokeless tobacco: Never Used  . Alcohol Use: No  . Drug Use: No  . Sexual Activity: Yes   Other Topics Concern  . None   Social History Narrative    Labs: HEP B S AB (no units)  Date Value  02/16/2015 NEG   HEPATITIS B SURFACE AG (no units)  Date Value  02/16/2015 NEGATIVE   HCV AB (no units)  Date Value  02/16/2015 REACTIVE*    Lab Results  Component Value Date   HCVGENOTYPE 1a 03/21/2015    Hepatitis C RNA quantitative Latest Ref Rng 02/16/2015  HCV Quantitative <15 IU/mL 823397(H)  HCV Quantitative Log <1.18 log 10 5.92(H)    AST (U/L)  Date Value  03/21/2015 38*  03/16/2015 CANCELED  02/16/2015 31   ALT (U/L)  Date Value  03/21/2015 41*  03/16/2015 CANCELED  02/16/2015 28   INR (no units)  Date Value  03/21/2015 1.12  03/16/2015 CANCELED    CrCl: CrCl cannot be calculated (Patient has no serum creatinine result on file.).  Fibrosis Score: F0/F1 as assessed by US elastography  Child-Pugh Score: Class A  Previous Treatment Regimen: Treatment naive  Assessment: Ms Dana Long has [employer] insurance and the results of her elastography which are mentioned above,  show she has mild disease. Discussed with the patient that her insurance will not cover her hepatitis treatment. She expressed frustration, which is expected. Reviewed other insurance options for the patient and she is interested in obtaining other insurance.   Recommendations: Attempt to switch insurance policies Continue to avoid alcohol and limit tylenol intake F/u per Dr. Fredderick Erbomer   Chantz Montefusco, [place] M, Pharm.D.,  Clinical Infectious Disease Pharmacist [facility] 05/02/2015, 4:35 PM

## 2015-05-02 NOTE — Progress Notes (Signed)
   Subjective:    Patient ID: Dana Long, female    DOB: 1951-11-21, 64 y.o.   MRN: [id number]  HPI She is here for follow-up of her hepatitis C. She has genotype 1A with an initial viral load of 800,000. She is hepatitis a immune and B nonimmune. She has some mild transaminitis and recently had her elastography which is consistent with little to no fibrosis at F0 to F1. Pleased with this result.   Review of Systems  Constitutional: Negative for fatigue.  Gastrointestinal: Negative for nausea and diarrhea.  Skin: Negative for rash.  Neurological: Negative for dizziness and light-headedness.       Objective:   Physical Exam  Constitutional: She appears well-developed and well-nourished. No distress.  Eyes: No scleral icterus.  Cardiovascular: Normal rate, regular rhythm and normal heart sounds.   No murmur heard. Pulmonary/Chest: Effort normal and breath sounds normal. No respiratory distress.  Skin: No rash noted.          Assessment & Plan:

## 2015-05-02 NOTE — Progress Notes (Signed)
   Subjective:    Patient ID: Dana Long, female    DOB: Nov 07, 1951, 64 y.o.   MRN: [id number]  HPI Dana Long is a 64 y/o female with Hep C who is here for f/u elastography and lab work. She has mild transaminitis and and staged F0/F1 per elastography. She has been avoiding Tylenol and alcohol and denies any complaints. Unfortunately, her current insurance company will not cover treatment with Harvoni. [name] with pharmacy discussed with her in length about insurance coverage for hepatitis treatment. She is wanting to switch insurance plans to obtain coverage for treatment.     Review of Systems  Constitutional: Negative for fever, chills, appetite change, fatigue and unexpected weight change.  HENT: Negative for dental problem, mouth sores and sore throat.   Eyes: Negative for visual disturbance.  Respiratory: Negative for cough and shortness of breath.   Cardiovascular: Negative for leg swelling.  Gastrointestinal: Negative for nausea, vomiting, abdominal pain, diarrhea, constipation, blood in stool and abdominal distention.  Genitourinary: Negative for difficulty urinating.  Musculoskeletal: Negative for myalgias.  Skin: Negative for color change, pallor, rash and wound.  Neurological: Negative for weakness, light-headedness and headaches.  Hematological: Negative for adenopathy.  Psychiatric/Behavioral: Negative for behavioral problems and confusion.       Objective:   Physical Exam  Constitutional: She is oriented to person, place, and time. She appears well-developed and well-nourished.  Eyes: Conjunctivae are normal. Pupils are equal, round, and reactive to light.  Neck: Normal range of motion. Neck supple.  Cardiovascular: Normal rate and regular rhythm.   Pulmonary/Chest: Effort normal and breath sounds normal.  Abdominal: Soft. Bowel sounds are normal.  Musculoskeletal: Normal range of motion.  Lymphadenopathy:    She has no cervical adenopathy.  Neurological: She is  alert and oriented to person, place, and time.  Skin: Skin is warm and dry.  Psychiatric: She has a normal mood and affect.          Assessment & Plan:  Hepatitis C, genotype 1a: Patient is going to try and switch to an insurance company that covers Hep C treatment. Will call when she switches insurance. Check labs today. F/U in 6 months with liver U/S.

## 2015-05-02 NOTE — Assessment & Plan Note (Addendum)
Or shortly she has very little fibrosis if any. Unfortunately, with her insurance, this likely means that she will not get approved medication. This was discussed with her by me and our pharmacist. Regardless will continue to try and I will have her return in about 6 months time and at that time we will repeat the elastography to see if there is a different results and retry for the approval for her medication. He will get her hepatitis B #3 at her next visit as well.

## 2015-05-09 ENCOUNTER — Other Ambulatory Visit: Payer: Self-pay | Admitting: Pharmacist Clinician (PhC)/ Clinical Pharmacy Specialist

## 2015-05-09 MED ORDER — LEDIPASVIR-SOFOSBUVIR 90-400 MG PO TABS: 1.0000 | ORAL_TABLET | Freq: Every day | ORAL | Status: DC

## 2015-06-21 ENCOUNTER — Encounter: Payer: Self-pay | Admitting: Internal Medicine

## 2015-06-21 ENCOUNTER — Other Ambulatory Visit (INDEPENDENT_AMBULATORY_CARE_PROVIDER_SITE_OTHER): Payer: BLUE CROSS/BLUE SHIELD

## 2015-06-21 ENCOUNTER — Ambulatory Visit (INDEPENDENT_AMBULATORY_CARE_PROVIDER_SITE_OTHER): Payer: BLUE CROSS/BLUE SHIELD | Admitting: Internal Medicine

## 2015-06-21 VITALS — BP 140/90 | HR 85 | Wt 150.0 lb

## 2015-06-21 DIAGNOSIS — B182 Chronic viral hepatitis C: Secondary | ICD-10-CM | POA: Diagnosis not present

## 2015-06-21 DIAGNOSIS — I10 Essential (primary) hypertension: Secondary | ICD-10-CM

## 2015-06-21 DIAGNOSIS — K219 Gastro-esophageal reflux disease without esophagitis: Secondary | ICD-10-CM

## 2015-06-21 LAB — HEPATIC FUNCTION PANEL
ALBUMIN: 4.2 g/dL (ref 3.5–5.2)
ALK PHOS: 45 U/L (ref 39–117)
ALT: 21 U/L (ref 0–35)
AST: 24 U/L (ref 0–37)
BILIRUBIN DIRECT: 0.1 mg/dL (ref 0.0–0.3)
BILIRUBIN TOTAL: 0.4 mg/dL (ref 0.2–1.2)
Total Protein: 7.7 g/dL (ref 6.0–8.3)

## 2015-06-21 LAB — CBC WITH DIFFERENTIAL/PLATELET
Basophils Absolute: 0 10*3/uL (ref 0.0–0.1)
Basophils Relative: 0.4 % (ref 0.0–3.0)
EOS PCT: 4.2 % (ref 0.0–5.0)
Eosinophils Absolute: 0.2 10*3/uL (ref 0.0–0.7)
HCT: 41.8 % (ref 36.0–46.0)
HEMOGLOBIN: 13.7 g/dL (ref 12.0–15.0)
LYMPHS PCT: 49.2 % — AB (ref 12.0–46.0)
Lymphs Abs: 2.9 10*3/uL (ref 0.7–4.0)
MCHC: 32.9 g/dL (ref 30.0–36.0)
MCV: 82.5 fl (ref 78.0–100.0)
MONOS PCT: 7.7 % (ref 3.0–12.0)
Monocytes Absolute: 0.4 10*3/uL (ref 0.1–1.0)
Neutro Abs: 2.2 10*3/uL (ref 1.4–7.7)
Neutrophils Relative %: 38.5 % — ABNORMAL LOW (ref 43.0–77.0)
Platelets: 274 10*3/uL (ref 150.0–400.0)
RBC: 5.07 Mil/uL (ref 3.87–5.11)
RDW: 12.6 % (ref 11.5–15.5)
WBC: 5.8 10*3/uL (ref 4.0–10.5)

## 2015-06-21 LAB — BASIC METABOLIC PANEL
BUN: 10 mg/dL (ref 6–23)
CALCIUM: 10.1 mg/dL (ref 8.4–10.5)
CHLORIDE: 97 meq/L (ref 96–112)
CO2: 31 mEq/L (ref 19–32)
CREATININE: 0.72 mg/dL (ref 0.40–1.20)
GFR: 105.21 mL/min (ref >60.00)
Glucose, Bld: 86 mg/dL (ref 70–99)
POTASSIUM: 3.4 meq/L — AB (ref 3.5–5.1)
SODIUM: 136 meq/L (ref 135–145)

## 2015-06-21 MED ORDER — IPRATROPIUM BROMIDE 0.06 % NA SOLN: 2.0000 | Freq: Three times a day (TID) | NASAL | Status: DC

## 2015-06-21 NOTE — Assessment & Plan Note (Signed)
On Harvoni now

## 2015-06-21 NOTE — Assessment & Plan Note (Signed)
Chronic  Triamt/HCTZ 

## 2015-06-21 NOTE — Progress Notes (Signed)
Pre visit review using our clinic review tool, if applicable. No additional management support is needed unless otherwise documented below in the visit note. 

## 2015-06-21 NOTE — Progress Notes (Signed)
Subjective:  Patient ID: Dana Long, female    DOB: Nov 17, 1951  Age: 64 y.o. MRN: [id number]  CC: No chief complaint on file.   HPI Dana Long presents for HTN, OA, allergies  Outpatient Prescriptions Prior to Visit  Medication Sig Dispense Refill  . Calcium-Phosphorus-Vitamin D (CITRACAL +D3 PO) Take 1 each by mouth daily.    . fluticasone (FLONASE) 50 MCG/ACT nasal spray USE 2 SPRAYS INTO THE NOSE EVERY DAY 16 g 3  . ibuprofen (ADVIL,MOTRIN) 600 MG tablet Take 1 tablet (600 mg total) by mouth every 8 (eight) hours as needed. 60 tablet 2  . Ledipasvir-Sofosbuvir (HARVONI) 90-400 MG TABS Take 1 tablet by mouth daily. 28 tablet 2  . neomycin-polymyxin-hydrocortisone (CORTISPORIN) otic solution PLACE 3 DROPS IN RIGHT EAR 3 TIMES A DAY 10 mL 1  . triamterene-hydrochlorothiazide (MAXZIDE-25) 37.5-25 MG per tablet TAKE 1 TABLET BY MOUTH ONCE A DAY 90 tablet 3  . cholecalciferol (VITAMIN D) 1000 UNITS tablet Take 1,000 Units by mouth daily.    Marland Kitchen loratadine (CLARITIN) 10 MG tablet Take 1 tablet (10 mg total) by mouth daily. 100 tablet 3   No facility-administered medications prior to visit.    ROS Review of Systems  Constitutional: Positive for fatigue. Negative for chills, activity change, appetite change and unexpected weight change.  HENT: Positive for postnasal drip and rhinorrhea. Negative for congestion, mouth sores and sinus pressure.   Eyes: Negative for visual disturbance.  Respiratory: Negative for cough and chest tightness.   Gastrointestinal: Negative for nausea and abdominal pain.  Genitourinary: Negative for frequency, difficulty urinating and vaginal pain.  Musculoskeletal: Negative for back pain and gait problem.  Skin: Negative for pallor and rash.  Neurological: Negative for dizziness, tremors, weakness, numbness and headaches.  Psychiatric/Behavioral: Negative for suicidal ideas, confusion, sleep disturbance and dysphoric mood.    Objective:  BP 140/90 mmHg   Pulse 85  Wt 150 lb (68.04 kg)  SpO2 98%  BP Readings from Last 3 Encounters:  06/21/15 140/90  05/02/15 149/95  04/04/15 147/90    Wt Readings from Last 3 Encounters:  06/21/15 150 lb (68.04 kg)  05/02/15 147 lb (66.679 kg)  04/04/15 146 lb (66.225 kg)    Physical Exam  Constitutional: She appears well-developed. No distress.  HENT:  Head: Normocephalic.  Right Ear: External ear normal.  Left Ear: External ear normal.  Nose: Nose normal.  Mouth/Throat: Oropharynx is clear and moist.  Eyes: Conjunctivae are normal. Pupils are equal, round, and reactive to light. Right eye exhibits no discharge. Left eye exhibits no discharge.  Neck: Normal range of motion. Neck supple. No JVD present. No tracheal deviation present. No thyromegaly present.  Cardiovascular: Normal rate, regular rhythm and normal heart sounds.   Pulmonary/Chest: No stridor. No respiratory distress. She has no wheezes.  Abdominal: Soft. Bowel sounds are normal. She exhibits no distension and no mass. There is no tenderness. There is no rebound and no guarding.  Musculoskeletal: She exhibits no edema or tenderness.  Lymphadenopathy:    She has no cervical adenopathy.  Neurological: She displays normal reflexes. No cranial nerve deficit. She exhibits normal muscle tone. Coordination normal.  Skin: No rash noted. No erythema.  Psychiatric: She has a normal mood and affect. Her behavior is normal. Judgment and thought content normal.    Lab Results  Component Value Date   WBC 5.6 03/21/2015   HGB 13.4 03/21/2015   HCT 40.5 03/21/2015   PLT 268 03/21/2015   GLUCOSE 93 03/21/2015  CHOL 206* 11/12/2014   TRIG 146.0 11/12/2014   HDL 47.40 11/12/2014   LDLCALC 129* 11/12/2014   ALT 41* 03/21/2015   AST 38* 03/21/2015   NA 134* 03/21/2015   K 4.0 03/21/2015   CL 97 03/21/2015   CREATININE 0.74 03/21/2015   BUN 15 03/21/2015   CO2 30 03/21/2015   TSH 0.41 02/16/2015   INR 1.12 03/21/2015    US Abdomen  Complete W/elastography  04/28/2015   CLINICAL DATA:  Hepatitis C  EXAM: ULTRASOUND ABDOMEN COMPLETE  ULTRASOUND HEPATIC ELASTOGRAPHY  TECHNIQUE: Sonography of the upper abdomen was performed. In addition, ultrasound elastography evaluation of the liver was performed. A region of interest was placed within the right lobe of the liver. Following application of a compressive sonographic pulse, shear waves were detected in the adjacent hepatic tissue and the shear wave velocity was calculated. Multiple assessments were performed at the selected site. Median shear wave velocity is correlated to a Metavir fibrosis score.  COMPARISON:  None.  FINDINGS: ULTRASOUND ABDOMEN  Gallbladder: No gallstones, gallbladder wall thickening, or pericholecystic fluid. Negative sonographic Murphy's sign.  Common bile duct: Diameter: 3 mm  Liver: No focal lesion identified. Within normal limits in parenchymal echogenicity.  IVC: No abnormality visualized.  Pancreas: Visualized portion unremarkable.  Spleen: Size and appearance within normal limits.  Right Kidney: Length: 11.3 cm. No mass or hydronephrosis.  Left Kidney: Length: 11.8 cm. No mass or hydronephrosis.  Abdominal aorta: No aneurysm visualized.  Other findings: None.  ULTRASOUND HEPATIC ELASTOGRAPHY  Device: Siemens Helix VTQ  Transducer 6C1  Patient position:  Left lateral decubitus  Number of measurements:  10  Hepatic Segment:  8  Median velocity:   1.16  m/sec  IQR: 0.04  IQR/Median velocity ratio 0.03  Corresponding Metavir fibrosis score:  F0/F1  Risk of fibrosis: Minimal  Limitations of exam: None  Pertinent findings noted on other imaging exams:  None  Please note that abnormal shear wave velocities may also be identified in clinical settings other than with hepatic fibrosis, such as: acute hepatitis, elevated right heart and central venous pressures including use of beta blockers, veno-occlusive disease (Budd-Chiari), infiltrative processes such as  mastocytosis/amyloidosis/infiltrative tumor, extrahepatic cholestasis, in the post-prandial state, and liver transplantation. Correlation with patient history, laboratory data, and clinical condition recommended.  IMPRESSION: Unremarkable abdominal ultrasound.  Median hepatic shear wave velocity is calculated at 1.16 m/sec.  Corresponding Metavir fibrosis score is F0/F1.  Risk of fibrosis is minimal.  Follow-up:  None required.   Electronically Signed   By: Charline Bills M.D.   On: 04/28/2015 11:19    Assessment & Plan:   There are no diagnoses linked to this encounter. I am having Ms. Campa maintain her loratadine, cholecalciferol, ibuprofen, neomycin-polymyxin-hydrocortisone, fluticasone, triamterene-hydrochlorothiazide, Calcium-Phosphorus-Vitamin D (CITRACAL +D3 PO), and Ledipasvir-Sofosbuvir.  No orders of the defined types were placed in this encounter.     Follow-up: No Follow-up on file.  Sonda Primes, MD

## 2015-06-21 NOTE — Assessment & Plan Note (Signed)
Off Rx 

## 2015-07-07 ENCOUNTER — Telehealth: Payer: Self-pay | Admitting: Internal Medicine

## 2015-07-07 DIAGNOSIS — Z111 Encounter for screening for respiratory tuberculosis: Secondary | ICD-10-CM

## 2015-07-07 NOTE — Telephone Encounter (Signed)
Pt is needing to know when her last TB test was done. I do not see her having one in her chart. She states that she can no longer get the stick for that test, so she would be tested by chest x-ray instead. She needs to know what needs to be done about this. She needs to have this done by next week. Please advise patient at [phone]

## 2015-07-08 NOTE — Telephone Encounter (Signed)
Ok to order xray

## 2015-07-08 NOTE — Telephone Encounter (Signed)
Patient states she has to have an xray before the 19th for employer.  She is calling back in regards to see if she can come on in.

## 2015-07-08 NOTE — Telephone Encounter (Signed)
Ok Thx 

## 2015-07-11 ENCOUNTER — Ambulatory Visit (INDEPENDENT_AMBULATORY_CARE_PROVIDER_SITE_OTHER)
Admission: RE | Admit: 2015-07-11 | Discharge: 2015-07-11 | Disposition: A | Discharge status: Home / Self Care | Payer: BLUE CROSS/BLUE SHIELD | Source: Ambulatory Visit | Attending: Internal Medicine | Admitting: Internal Medicine

## 2015-07-11 DIAGNOSIS — Z111 Encounter for screening for respiratory tuberculosis: Secondary | ICD-10-CM

## 2015-07-11 NOTE — Telephone Encounter (Signed)
Chest xray order placed. Pt informed

## 2015-07-12 ENCOUNTER — Telehealth: Payer: Self-pay | Admitting: *Deleted

## 2015-07-12 NOTE — Telephone Encounter (Signed)
07/11/15 CXR faxed to [facility] @ [phone] per pt request.

## 2015-08-14 ENCOUNTER — Other Ambulatory Visit: Payer: Self-pay | Admitting: Internal Medicine

## 2015-08-26 ENCOUNTER — Ambulatory Visit (INDEPENDENT_AMBULATORY_CARE_PROVIDER_SITE_OTHER): Payer: BLUE CROSS/BLUE SHIELD

## 2015-08-26 DIAGNOSIS — Z23 Encounter for immunization: Secondary | ICD-10-CM | POA: Diagnosis not present

## 2015-09-17 ENCOUNTER — Other Ambulatory Visit: Payer: Self-pay | Admitting: Internal Medicine

## 2016-01-04 ENCOUNTER — Other Ambulatory Visit: Payer: Self-pay | Admitting: *Deleted

## 2016-01-04 MED ORDER — TRIAMTERENE-HCTZ 37.5-25 MG PO TABS: 1.0000 | ORAL_TABLET | Freq: Every day | ORAL | Status: DC

## 2016-01-05 ENCOUNTER — Encounter: Payer: Self-pay | Admitting: Internal Medicine

## 2016-01-05 ENCOUNTER — Other Ambulatory Visit (INDEPENDENT_AMBULATORY_CARE_PROVIDER_SITE_OTHER): Payer: BLUE CROSS/BLUE SHIELD

## 2016-01-05 ENCOUNTER — Ambulatory Visit (INDEPENDENT_AMBULATORY_CARE_PROVIDER_SITE_OTHER): Payer: BLUE CROSS/BLUE SHIELD | Admitting: Internal Medicine

## 2016-01-05 VITALS — BP 130/70 | HR 87 | Temp 97.9°F | Ht 61.0 in | Wt 150.2 lb

## 2016-01-05 DIAGNOSIS — K219 Gastro-esophageal reflux disease without esophagitis: Secondary | ICD-10-CM

## 2016-01-05 DIAGNOSIS — J302 Other seasonal allergic rhinitis: Secondary | ICD-10-CM | POA: Diagnosis not present

## 2016-01-05 DIAGNOSIS — B182 Chronic viral hepatitis C: Secondary | ICD-10-CM

## 2016-01-05 DIAGNOSIS — I10 Essential (primary) hypertension: Secondary | ICD-10-CM

## 2016-01-05 DIAGNOSIS — Z1211 Encounter for screening for malignant neoplasm of colon: Secondary | ICD-10-CM

## 2016-01-05 LAB — BASIC METABOLIC PANEL
BUN: 9 mg/dL (ref 6–23)
CALCIUM: 10.5 mg/dL (ref 8.4–10.5)
CO2: 32 mEq/L (ref 19–32)
CREATININE: 0.8 mg/dL (ref 0.40–1.20)
Chloride: 98 mEq/L (ref 96–112)
GFR: 93 mL/min (ref >60.00)
Glucose, Bld: 95 mg/dL (ref 70–99)
Potassium: 3.4 mEq/L — ABNORMAL LOW (ref 3.5–5.1)
Sodium: 138 mEq/L (ref 135–145)

## 2016-01-05 LAB — HEPATIC FUNCTION PANEL
ALBUMIN: 4.5 g/dL (ref 3.5–5.2)
ALK PHOS: 49 U/L (ref 39–117)
ALT: 15 U/L (ref 0–35)
AST: 20 U/L (ref 0–37)
BILIRUBIN DIRECT: 0.1 mg/dL (ref 0.0–0.3)
TOTAL PROTEIN: 8.2 g/dL (ref 6.0–8.3)
Total Bilirubin: 0.6 mg/dL (ref 0.2–1.2)

## 2016-01-05 NOTE — Assessment & Plan Note (Signed)
Chronic  Triamt/HCTZ

## 2016-01-05 NOTE — Assessment & Plan Note (Signed)
On prn meds only

## 2016-01-05 NOTE — Progress Notes (Signed)
Pre visit review using our clinic review tool, if applicable. No additional management support is needed unless otherwise documented below in the visit note. 

## 2016-01-05 NOTE — Assessment & Plan Note (Signed)
Chronic  Flonase

## 2016-01-05 NOTE — Assessment & Plan Note (Signed)
s/p Harvoni Rx 2016  -- Sch f/u w/Dr Luciana Axe

## 2016-01-05 NOTE — Progress Notes (Signed)
Subjective:  Patient ID: Dana Long, female    DOB: 1951-03-31  Age: 65 y.o. MRN: [id number]  CC: No chief complaint on file.   HPI Dana Long presents for HTN, Hep C, allergies f/u. Pt has finished Harvoni.  Outpatient Prescriptions Prior to Visit  Medication Sig Dispense Refill  . Calcium-Phosphorus-Vitamin D (CITRACAL +D3 PO) Take 1 each by mouth daily.    . fluticasone (FLONASE) 50 MCG/ACT nasal spray USE 2 SPRAYS INTO THE NOSE EVERY DAY 16 g 0  . ibuprofen (ADVIL,MOTRIN) 600 MG tablet Take 1 tablet (600 mg total) by mouth every 8 (eight) hours as needed. 60 tablet 2  . ipratropium (ATROVENT) 0.06 % nasal spray Place 2 sprays into the nose 3 (three) times daily. 15 mL 2  . Ledipasvir-Sofosbuvir (HARVONI) 90-400 MG TABS Take 1 tablet by mouth daily. 28 tablet 2  . neomycin-polymyxin-hydrocortisone (CORTISPORIN) 3.5-10000-1 otic suspension PLACE 3 DROPS IN RIGHT EAR 3 TIMES A DAY 10 mL 1  . triamterene-hydrochlorothiazide (MAXZIDE-25) 37.5-25 MG tablet Take 1 tablet by mouth daily. 90 tablet 3  . cholecalciferol (VITAMIN D) 1000 UNITS tablet Take 1,000 Units by mouth daily.    Marland Kitchen loratadine (CLARITIN) 10 MG tablet Take 1 tablet (10 mg total) by mouth daily. 100 tablet 3   No facility-administered medications prior to visit.    ROS Review of Systems  Constitutional: Negative for chills, activity change, appetite change, fatigue and unexpected weight change.  HENT: Negative for congestion, mouth sores and sinus pressure.   Eyes: Negative for visual disturbance.  Respiratory: Negative for cough and chest tightness.   Gastrointestinal: Negative for nausea and abdominal pain.  Genitourinary: Negative for frequency, difficulty urinating and vaginal pain.  Musculoskeletal: Negative for back pain and gait problem.  Skin: Negative for pallor and rash.  Neurological: Negative for dizziness, tremors, weakness, numbness and headaches.  Psychiatric/Behavioral: Negative for confusion  and sleep disturbance.    Objective:  BP 130/70 mmHg  Pulse 87  Temp(Src) 97.9 F (36.6 C) (Oral)  Ht  (1.549 m)  Wt 150 lb 4 oz (68.153 kg)  BMI 28.40 kg/m2  SpO2 97%  BP Readings from Last 3 Encounters:  01/05/16 130/70  06/21/15 140/90  05/02/15 149/95    Wt Readings from Last 3 Encounters:  01/05/16 150 lb 4 oz (68.153 kg)  06/21/15 150 lb (68.04 kg)  05/02/15 147 lb (66.679 kg)    Physical Exam  Constitutional: She appears well-developed. No distress.  HENT:  Head: Normocephalic.  Right Ear: External ear normal.  Left Ear: External ear normal.  Nose: Nose normal.  Mouth/Throat: Oropharynx is clear and moist.  Eyes: Conjunctivae are normal. Pupils are equal, round, and reactive to light. Right eye exhibits no discharge. Left eye exhibits no discharge.  Neck: Normal range of motion. Neck supple. No JVD present. No tracheal deviation present. No thyromegaly present.  Cardiovascular: Normal rate, regular rhythm and normal heart sounds.   Pulmonary/Chest: No stridor. No respiratory distress. She has no wheezes.  Abdominal: Soft. Bowel sounds are normal. She exhibits no distension and no mass. There is no tenderness. There is no rebound and no guarding.  Musculoskeletal: She exhibits no edema or tenderness.  Lymphadenopathy:    She has no cervical adenopathy.  Neurological: She displays normal reflexes. No cranial nerve deficit. She exhibits normal muscle tone. Coordination normal.  Skin: No rash noted. No erythema.  Psychiatric: She has a normal mood and affect. Her behavior is normal. Judgment and thought content normal.  Lab Results  Component Value Date   WBC 5.8 06/21/2015   HGB 13.7 06/21/2015   HCT 41.8 06/21/2015   PLT 274.0 06/21/2015   GLUCOSE 86 06/21/2015   CHOL 206* 11/12/2014   TRIG 146.0 11/12/2014   HDL 47.40 11/12/2014   LDLCALC 129* 11/12/2014   ALT 21 06/21/2015   AST 24 06/21/2015   NA 136 06/21/2015   K 3.4* 06/21/2015   CL 97  06/21/2015   CREATININE 0.72 06/21/2015   BUN 10 06/21/2015   CO2 31 06/21/2015   TSH 0.41 02/16/2015   INR 1.12 03/21/2015    Dg Chest 2 View  07/11/2015  CLINICAL DATA:  TB screening for implant hypertension. EXAM: CHEST  2 VIEW COMPARISON:  11/14/2012 FINDINGS: Lungs are adequately inflated without consolidation or effusion. Cardiomediastinal silhouette is within normal. There is minimal calcified plaque over the aortic arch. There are mild degenerate changes of the spine. IMPRESSION: No active cardiopulmonary disease. Electronically Signed   By: Elberta Fortis M.D.   On: 07/11/2015 15:37    Assessment & Plan:   Diagnoses and all orders for this visit:  Hep C w/o coma, chronic (HCC) -     Ambulatory referral to Infectious Disease -     Hepatic function panel; Future -     Basic metabolic panel; Future -     Hepatitis C RNA quantitative; Future  Essential hypertension -     Basic metabolic panel; Future  Allergic rhinitis, seasonal  Gastroesophageal reflux disease without esophagitis  Colon cancer screening -     Ambulatory referral to Gastroenterology  Other orders -     Cancel: Ambulatory referral to Gastroenterology   I am having Dana Long maintain her loratadine, cholecalciferol, ibuprofen, Calcium-Phosphorus-Vitamin D (CITRACAL +D3 PO), Ledipasvir-Sofosbuvir, ipratropium, fluticasone, neomycin-polymyxin-hydrocortisone, and triamterene-hydrochlorothiazide.  No orders of the defined types were placed in this encounter.     Follow-up: Return in about 6 months (around 07/04/2016) for Wellness Exam.  Sonda Primes, MD

## 2016-01-09 LAB — HEPATITIS C RNA QUANTITATIVE: HCV QUANT: NOT DETECTED [IU]/mL (ref <15)

## 2016-01-11 LAB — HM MAMMOGRAPHY

## 2016-01-12 ENCOUNTER — Telehealth: Payer: Self-pay | Admitting: Internal Medicine

## 2016-01-12 NOTE — Telephone Encounter (Signed)
Patient is returning your call. i offered to read lab results, and she declined. She requests that you give her a call

## 2016-01-13 ENCOUNTER — Telehealth: Payer: Self-pay | Admitting: Gastroenterology

## 2016-01-13 NOTE — Telephone Encounter (Signed)
Received records from [facility] and placed on Dr. Ardell Isaacs desk for review. Referring Provider is requesting that patient see Dr. Russella Dar.

## 2016-01-13 NOTE — Telephone Encounter (Signed)
Infrom pt of the massage in the lab result but pt want to speak to the assistant. Please call her back

## 2016-01-18 NOTE — Telephone Encounter (Signed)
Pt informed of below.   Notes Recorded by Tresa Garter, MD on 01/09/2016 at 9:40 PM Misty Stanley, please, inform patient that her Hep C virus has been eliminated - good! Thx

## 2016-01-23 ENCOUNTER — Encounter: Payer: Self-pay | Admitting: Gastroenterology

## 2016-01-23 NOTE — Telephone Encounter (Signed)
Dr. Stark reviewed records and has accepted patient. Ok to schedule Direct Colon. Colonoscopy scheduled. °

## 2016-02-06 ENCOUNTER — Other Ambulatory Visit: Payer: Self-pay | Admitting: Internal Medicine

## 2016-02-06 NOTE — Telephone Encounter (Signed)
Pt called to follow up on the refill request for her nasal sprays. She's hoping you can give her a call. She states she started to get a cough and she is requesting a zpac to get ahold on whatever she's trying to get.

## 2016-02-10 ENCOUNTER — Encounter: Payer: Self-pay | Admitting: Internal Medicine

## 2016-02-10 ENCOUNTER — Ambulatory Visit (INDEPENDENT_AMBULATORY_CARE_PROVIDER_SITE_OTHER): Payer: BLUE CROSS/BLUE SHIELD | Admitting: Internal Medicine

## 2016-02-10 VITALS — BP 120/84 | HR 99 | Temp 98.3°F | Wt 149.0 lb

## 2016-02-10 DIAGNOSIS — J0101 Acute recurrent maxillary sinusitis: Secondary | ICD-10-CM

## 2016-02-10 MED ORDER — PROMETHAZINE-CODEINE 6.25-10 MG/5ML PO SYRP: 5.0000 mL | ORAL_SOLUTION | ORAL | Status: DC | PRN

## 2016-02-10 MED ORDER — AZITHROMYCIN 250 MG PO TABS: ORAL_TABLET | ORAL | Status: DC

## 2016-02-10 NOTE — Assessment & Plan Note (Signed)
Zpac Prom-cod syr Treat allergies - claritin or Zyrtec

## 2016-02-10 NOTE — Progress Notes (Signed)
Pre visit review using our clinic review tool, if applicable. No additional management support is needed unless otherwise documented below in the visit note. 

## 2016-02-11 NOTE — Progress Notes (Signed)
Subjective:  Patient ID: Dana Long, female    DOB: 1951/08/19  Age: 65 y.o. MRN: [id number]  CC: Sinusitis and Cough   HPI Dana Long presents for URI sx's x 3 wks - cloudy nasal d/c  Outpatient Prescriptions Prior to Visit  Medication Sig Dispense Refill  . Calcium-Phosphorus-Vitamin D (CITRACAL +D3 PO) Take 1 each by mouth daily.    . fluticasone (FLONASE) 50 MCG/ACT nasal spray USE 2 SPRAYS INTO THE NOSE EVERY DAY 16 g 2  . ibuprofen (ADVIL,MOTRIN) 600 MG tablet Take 1 tablet (600 mg total) by mouth every 8 (eight) hours as needed. 60 tablet 2  . ipratropium (ATROVENT) 0.06 % nasal spray SPRAY 2 SPRAYS INTO EACH NOSTRIL 3 TIMES A DAY 15 mL 2  . Ledipasvir-Sofosbuvir (HARVONI) 90-400 MG TABS Take 1 tablet by mouth daily. 28 tablet 2  . neomycin-polymyxin-hydrocortisone (CORTISPORIN) 3.5-10000-1 otic suspension PLACE 3 DROPS IN RIGHT EAR 3 TIMES A DAY 10 mL 1  . triamterene-hydrochlorothiazide (MAXZIDE-25) 37.5-25 MG tablet Take 1 tablet by mouth daily. 90 tablet 3  . cholecalciferol (VITAMIN D) 1000 UNITS tablet Take 1,000 Units by mouth daily.    Marland Kitchen loratadine (CLARITIN) 10 MG tablet Take 1 tablet (10 mg total) by mouth daily. 100 tablet 3   No facility-administered medications prior to visit.    ROS Review of Systems  Constitutional: Positive for fatigue. Negative for chills, activity change, appetite change and unexpected weight change.  HENT: Positive for congestion, sinus pressure and sore throat. Negative for mouth sores.   Eyes: Negative for visual disturbance.  Respiratory: Negative for cough and chest tightness.   Gastrointestinal: Negative for nausea and abdominal pain.  Genitourinary: Negative for frequency, difficulty urinating and vaginal pain.  Musculoskeletal: Negative for back pain and gait problem.  Skin: Negative for pallor and rash.  Neurological: Negative for dizziness, tremors, weakness, numbness and headaches.  Psychiatric/Behavioral: Negative for  confusion and sleep disturbance.    Objective:  BP 120/84 mmHg  Pulse 99  Temp(Src) 98.3 F (36.8 C) (Oral)  Wt 149 lb (67.586 kg)  SpO2 96%  BP Readings from Last 3 Encounters:  02/10/16 120/84  01/05/16 130/70  06/21/15 140/90    Wt Readings from Last 3 Encounters:  02/10/16 149 lb (67.586 kg)  01/05/16 150 lb 4 oz (68.153 kg)  06/21/15 150 lb (68.04 kg)    Physical Exam  Constitutional: She appears well-developed. No distress.  HENT:  Head: Normocephalic.  Right Ear: External ear normal.  Left Ear: External ear normal.  Nose: Nose normal.  Eyes: Conjunctivae are normal. Pupils are equal, round, and reactive to light. Right eye exhibits no discharge. Left eye exhibits no discharge.  Neck: Normal range of motion. Neck supple. No JVD present. No tracheal deviation present. No thyromegaly present.  Cardiovascular: Normal rate, regular rhythm and normal heart sounds.   Pulmonary/Chest: No stridor. No respiratory distress. She has no wheezes.  Abdominal: Soft. Bowel sounds are normal. She exhibits no distension and no mass. There is no tenderness. There is no rebound and no guarding.  Musculoskeletal: She exhibits no edema or tenderness.  Lymphadenopathy:    She has no cervical adenopathy.  Neurological: She displays normal reflexes. No cranial nerve deficit. She exhibits normal muscle tone. Coordination normal.  Skin: No rash noted. No erythema.  Psychiatric: She has a normal mood and affect. Her behavior is normal. Judgment and thought content normal.  eryth nasl mucosa  Lab Results  Component Value Date   WBC 5.8 06/21/2015  HGB 13.7 06/21/2015   HCT 41.8 06/21/2015   PLT 274.0 06/21/2015   GLUCOSE 95 01/05/2016   CHOL 206* 11/12/2014   TRIG 146.0 11/12/2014   HDL 47.40 11/12/2014   LDLCALC 129* 11/12/2014   ALT 15 01/05/2016   AST 20 01/05/2016   NA 138 01/05/2016   K 3.4* 01/05/2016   CL 98 01/05/2016   CREATININE 0.80 01/05/2016   BUN 9 01/05/2016    CO2 32 01/05/2016   TSH 0.41 02/16/2015   INR 1.12 03/21/2015    Dg Chest 2 View  07/11/2015  CLINICAL DATA:  TB screening for implant hypertension. EXAM: CHEST  2 VIEW COMPARISON:  11/14/2012 FINDINGS: Lungs are adequately inflated without consolidation or effusion. Cardiomediastinal silhouette is within normal. There is minimal calcified plaque over the aortic arch. There are mild degenerate changes of the spine. IMPRESSION: No active cardiopulmonary disease. Electronically Signed   By: Elberta Fortisaniel  Boyle M.D.   On: 07/11/2015 15:37    Assessment & Plan:   Jonathan was seen today for sinusitis and cough.  Diagnoses and all orders for this visit:  Acute recurrent maxillary sinusitis  Other orders -     promethazine-codeine (PHENERGAN WITH CODEINE) 6.25-10 MG/5ML syrup; Take 5 mLs by mouth every 4 (four) hours as needed. -     azithromycin (ZITHROMAX) 250 MG tablet; As directed   I am having Dana Long start on promethazine-codeine and azithromycin. I am also having her maintain her loratadine, cholecalciferol, ibuprofen, Calcium-Phosphorus-Vitamin D (CITRACAL +D3 PO), Ledipasvir-Sofosbuvir, neomycin-polymyxin-hydrocortisone, triamterene-hydrochlorothiazide, fluticasone, and ipratropium.  Meds ordered this encounter  Medications  . promethazine-codeine (PHENERGAN WITH CODEINE) 6.25-10 MG/5ML syrup    Sig: Take 5 mLs by mouth every 4 (four) hours as needed.    Dispense:  300 mL    Refill:  0  . azithromycin (ZITHROMAX) 250 MG tablet    Sig: As directed    Dispense:  6 tablet    Refill:  0     Follow-up: No Follow-up on file.  Sonda PrimesAlex Penelope Fittro, MD

## 2016-02-13 ENCOUNTER — Ambulatory Visit: Payer: BLUE CROSS/BLUE SHIELD | Admitting: Internal Medicine

## 2016-02-21 ENCOUNTER — Ambulatory Visit (AMBULATORY_SURGERY_CENTER): Payer: Self-pay

## 2016-02-21 ENCOUNTER — Telehealth: Payer: Self-pay | Admitting: Gastroenterology

## 2016-02-21 VITALS — Ht 61.0 in | Wt 152.6 lb

## 2016-02-21 DIAGNOSIS — Z1211 Encounter for screening for malignant neoplasm of colon: Secondary | ICD-10-CM

## 2016-02-21 MED ORDER — SUPREP BOWEL PREP KIT 17.5-3.13-1.6 GM/177ML PO SOLN: 1.0000 | Freq: Once | ORAL | Status: DC

## 2016-02-21 NOTE — Progress Notes (Signed)
No allergies to eggs or soy No home oxygen No diet/weight loss meds No past problems with anesthesia  Has email and internet; registered for emmi 

## 2016-02-24 ENCOUNTER — Telehealth: Payer: Self-pay | Admitting: Gastroenterology

## 2016-02-24 NOTE — Telephone Encounter (Signed)
Patient states the prep is over 100 dollars and is not sure she can afford even with a coupon that the pharmacist told her to get. I told patient that I will mail her the Miralax instructions and the Suprep coupon but she needs to decide and let us know which prep she chooses to pick. Pt verbalized understanding.

## 2016-02-24 NOTE — Telephone Encounter (Signed)
Informed patient we may not have a sample come in before her procedure but offered patient to switch to Miralax prep. Patient states she is not sure how much the Suprep is at the pharmacy and will call and find out. Patient states she will call me back with her decision.

## 2016-03-02 ENCOUNTER — Telehealth: Payer: Self-pay | Admitting: Internal Medicine

## 2016-03-02 ENCOUNTER — Telehealth: Payer: Self-pay | Admitting: Gastroenterology

## 2016-03-02 NOTE — Telephone Encounter (Signed)
Patient aware ok to keep her polish on

## 2016-03-02 NOTE — Telephone Encounter (Signed)
Pt request speak to the assistant concern about chest x-ray that was done last year for her job. Please give her a call back

## 2016-03-05 NOTE — Telephone Encounter (Signed)
Faxed report to fax# below...Raechel Chute/lmb

## 2016-03-05 NOTE — Telephone Encounter (Signed)
Patient wanted to know if she needed another chest xray. Informed patient that it would be up to her employer.   Patient request xray result to fax number: [phone] Attn: [employer]

## 2016-03-05 NOTE — Telephone Encounter (Signed)
See Previous Encounter

## 2016-03-06 ENCOUNTER — Ambulatory Visit (AMBULATORY_SURGERY_CENTER): Payer: BLUE CROSS/BLUE SHIELD | Admitting: Gastroenterology

## 2016-03-06 ENCOUNTER — Encounter: Payer: Self-pay | Admitting: Gastroenterology

## 2016-03-06 VITALS — BP 145/83 | HR 72 | Temp 98.0°F | Resp 15 | Ht 61.0 in | Wt 152.0 lb

## 2016-03-06 DIAGNOSIS — Z1211 Encounter for screening for malignant neoplasm of colon: Secondary | ICD-10-CM

## 2016-03-06 MED ORDER — SODIUM CHLORIDE 0.9 % IV SOLN: 500.0000 mL | INTRAVENOUS | Status: DC

## 2016-03-06 NOTE — Patient Instructions (Signed)
Discharge instructions given. Handout on diverticulosis and a high fiber diet. Resume previous medications. YOU HAD AN ENDOSCOPIC PROCEDURE TODAY AT THE [facility]:   Refer to the procedure report that was given to you for any specific questions about what was found during the examination.  If the procedure report does not answer your questions, please call your gastroenterologist to clarify.  If you requested that your care partner not be given the details of your procedure findings, then the procedure report has been included in a sealed envelope for you to review at your convenience later.  YOU SHOULD EXPECT: Some feelings of bloating in the abdomen. Passage of more gas than usual.  Walking can help get rid of the air that was put into your GI tract during the procedure and reduce the bloating. If you had a lower endoscopy (such as a colonoscopy or flexible sigmoidoscopy) you may notice spotting of blood in your stool or on the toilet paper. If you underwent a bowel prep for your procedure, you may not have a normal bowel movement for a few days.  Please Note:  You might notice some irritation and congestion in your nose or some drainage.  This is from the oxygen used during your procedure.  There is no need for concern and it should clear up in a day or so.  SYMPTOMS TO REPORT IMMEDIATELY:   Following lower endoscopy (colonoscopy or flexible sigmoidoscopy):  Excessive amounts of blood in the stool  Significant tenderness or worsening of abdominal pains  Swelling of the abdomen that is new, acute  Fever of 100F or higher   For urgent or emergent issues, a gastroenterologist can be reached at any hour by calling [phone].   DIET: Your first meal following the procedure should be a small meal and then it is ok to progress to your normal diet. Heavy or fried foods are harder to digest and may make you feel nauseous or bloated.  Likewise, meals heavy in dairy and vegetables  can increase bloating.  Drink plenty of fluids but you should avoid alcoholic beverages for 24 hours.  ACTIVITY:  You should plan to take it easy for the rest of today and you should NOT DRIVE or use heavy machinery until tomorrow (because of the sedation medicines used during the test).    FOLLOW UP: Our staff will call the number listed on your records the next business day following your procedure to check on you and address any questions or concerns that you may have regarding the information given to you following your procedure. If we do not reach you, we will leave a message.  However, if you are feeling well and you are not experiencing any problems, there is no need to return our call.  We will assume that you have returned to your regular daily activities without incident.  If any biopsies were taken you will be contacted by phone or by letter within the next 1-3 weeks.  Please call us at [phone] if you have not heard about the biopsies in 3 weeks.    SIGNATURES/CONFIDENTIALITY: You and/or your care partner have signed paperwork which will be entered into your electronic medical record.  These signatures attest to the fact that that the information above on your After Visit Summary has been reviewed and is understood.  Full responsibility of the confidentiality of this discharge information lies with you and/or your care-partner. 

## 2016-03-06 NOTE — Progress Notes (Signed)
Report to PACU, RN, vss, BBS= Clear.  

## 2016-03-06 NOTE — Op Note (Signed)
[facility] Patient Name: Dana Long Procedure Date: 03/06/2016 2:25 PM MRN: [id number] Endoscopist: Meryl Dare MD, MD Age: 65 Date of Birth: January 06, 1951 Gender: Female Procedure:                Colonoscopy Indications:              Screening for colorectal malignant neoplasm Medicines:                Monitored Anesthesia Care Procedure:                Pre-Anesthesia Assessment:                           - Prior to the procedure, a History and Physical                            was performed, and patient medications and                            allergies were reviewed. The patient's tolerance of                            previous anesthesia was also reviewed. The risks                            and benefits of the procedure and the sedation                            options and risks were discussed with the patient.                            All questions were answered, and informed consent                            was obtained. Prior Anticoagulants: The patient has                            taken no previous anticoagulant or antiplatelet                            agents. ASA Grade Assessment: II - A patient with                            mild systemic disease. After reviewing the risks                            and benefits, the patient was deemed in                            satisfactory condition to undergo the procedure.                           After obtaining informed consent, the colonoscope  was passed under direct vision. Throughout the                            procedure, the patient's blood pressure, pulse, and                            oxygen saturations were monitored continuously. The                            Model PCF-H190L [id number](SN#[id number]) scope was introduced                            through the anus and advanced to the the cecum,                            identified by appendiceal orifice and ileocecal                          valve. The colonoscopy was performed without                            difficulty. The patient tolerated the procedure                            well. The quality of the bowel preparation was                            excellent. The ileocecal valve, appendiceal                            orifice, and rectum were photographed. Scope In: 2:32:08 PM Scope Out: 2:46:51 PM Scope Withdrawal Time: 0 hours 12 minutes 9 seconds  Total Procedure Duration: 0 hours 14 minutes 43 seconds  Findings:                 The digital rectal exam was normal.                           A few medium-mouthed diverticula were found in the                            sigmoid colon.                           The exam was otherwise normal throughout the                            examined colon.                           The retroflexed view of the distal rectum and anal                            verge was normal and showed no anal or rectal  abnormalities. Complications:            No immediate complications. Estimated Blood Loss:     Estimated blood loss: none. Impression:               - Mild diverticulosis in the sigmoid colon. Recommendation:           - Patient has a contact number available for                            emergencies. The signs and symptoms of potential                            delayed complications were discussed with the                            patient. Return to normal activities tomorrow.                            Written discharge instructions were provided to the                            patient.                           - Resume previous diet with addition of high fiber.                           - Continue present medications.                           - Repeat colonoscopy in 10 years for screening                            purposes and for surveillance. Meryl Dare MD, MD 03/06/2016 2:51:03 PM This report has been  signed electronically.

## 2016-03-06 NOTE — Progress Notes (Signed)
No problems noted in the recovery room. maw 

## 2016-03-07 ENCOUNTER — Telehealth: Payer: Self-pay | Admitting: Internal Medicine

## 2016-03-07 ENCOUNTER — Telehealth: Payer: Self-pay

## 2016-03-07 NOTE — Telephone Encounter (Signed)
Pt called in to update CMA with the correct fax number to have TB test faxed to. : [phone]

## 2016-03-07 NOTE — Telephone Encounter (Signed)
  Follow up Call-  Call back number 03/06/2016  Post procedure Call Back phone  # [phone]  Permission to leave phone message Yes     Patient questions:  Do you have a fever, pain , or abdominal swelling? No. Pain Score  0 *  Have you tolerated food without any problems? Yes.    Have you been able to return to your normal activities? Yes.    Do you have any questions about your discharge instructions: Diet   No. Medications  No. Follow up visit  No.  Do you have questions or concerns about your Care? No.  Actions: * If pain score is 4 or above: No action needed, pain <4.

## 2016-03-08 NOTE — Telephone Encounter (Signed)
07/21/15 CXR faxed to number below.   Attn Judeen Hammansina Carbone, HR.

## 2016-03-20 ENCOUNTER — Ambulatory Visit (INDEPENDENT_AMBULATORY_CARE_PROVIDER_SITE_OTHER): Payer: BLUE CROSS/BLUE SHIELD | Admitting: Internal Medicine

## 2016-03-20 ENCOUNTER — Encounter: Payer: Self-pay | Admitting: Internal Medicine

## 2016-03-20 VITALS — BP 143/86 | HR 80 | Temp 98.1°F | Ht 61.0 in | Wt 151.0 lb

## 2016-03-20 DIAGNOSIS — K7400 Hepatic fibrosis, unspecified: Secondary | ICD-10-CM | POA: Insufficient documentation

## 2016-03-20 DIAGNOSIS — Z23 Encounter for immunization: Secondary | ICD-10-CM | POA: Diagnosis not present

## 2016-03-20 DIAGNOSIS — B182 Chronic viral hepatitis C: Secondary | ICD-10-CM | POA: Diagnosis not present

## 2016-03-20 NOTE — Progress Notes (Signed)
   Subjective:    Patient ID: Dana Long, female    DOB: 1951/02/21, 65 y.o.   MRN: [id number]  HPI She is here for follow-up of her hepatitis C.  She has genotype 1A with an initial viral load of 800,000. She is hepatitis a immune and B nonimmune. She has some mild transaminitis and recently had her elastography which is consistent with little to no fibrosis at F0 to F1. Pleased with this result.  Completed Harvoni over 6 months ago and here for SVR24.  Never had SVR12.  Hep B #3 today.  End of treatment was undetectable.     Review of Systems  Constitutional: Negative for fatigue.  Gastrointestinal: Negative for nausea and diarrhea.  Skin: Negative for rash.  Neurological: Negative for dizziness and light-headedness.       Objective:   Physical Exam  Constitutional: She appears well-developed and well-nourished. No distress.  Eyes: No scleral icterus.  Cardiovascular: Normal rate, regular rhythm and normal heart sounds.   No murmur heard. Pulmonary/Chest: Effort normal and breath sounds normal. No respiratory distress.  Skin: No rash noted.          Assessment & Plan:

## 2016-03-20 NOTE — Assessment & Plan Note (Signed)
Will check today and if viral load negative, she will be considered cured.  I discussed that her antibody will always be positive.   RTC PRN

## 2016-03-20 NOTE — Assessment & Plan Note (Signed)
Minimal damage.  Will not need further follow up of her liver for hepatitis C.

## 2016-03-22 LAB — HEPATITIS C RNA QUANTITATIVE: HCV QUANT: NOT DETECTED [IU]/mL (ref <15)

## 2016-04-24 ENCOUNTER — Telehealth: Payer: Self-pay | Admitting: Internal Medicine

## 2016-04-30 ENCOUNTER — Ambulatory Visit: Payer: BLUE CROSS/BLUE SHIELD | Admitting: Internal Medicine

## 2016-09-10 ENCOUNTER — Ambulatory Visit: Payer: BLUE CROSS/BLUE SHIELD

## 2016-09-10 ENCOUNTER — Ambulatory Visit: Payer: BLUE CROSS/BLUE SHIELD | Admitting: Internal Medicine

## 2016-10-17 ENCOUNTER — Telehealth: Payer: Self-pay | Admitting: *Deleted

## 2016-10-17 NOTE — Telephone Encounter (Signed)
Pt left a vm requesting her CXR for TB testing. I called pt and she states her employer informed her she is good until 2021. I advised pt to keep ROV as scheduled in 12/2016.

## 2016-11-12 ENCOUNTER — Other Ambulatory Visit: Payer: Self-pay | Admitting: Internal Medicine

## 2017-01-07 ENCOUNTER — Ambulatory Visit (INDEPENDENT_AMBULATORY_CARE_PROVIDER_SITE_OTHER): Payer: BLUE CROSS/BLUE SHIELD | Admitting: Internal Medicine

## 2017-01-07 ENCOUNTER — Other Ambulatory Visit (INDEPENDENT_AMBULATORY_CARE_PROVIDER_SITE_OTHER): Payer: BLUE CROSS/BLUE SHIELD

## 2017-01-07 ENCOUNTER — Encounter: Payer: Self-pay | Admitting: Internal Medicine

## 2017-01-07 VITALS — BP 116/76 | HR 84 | Temp 97.7°F | Resp 16 | Ht 61.0 in | Wt 145.2 lb

## 2017-01-07 DIAGNOSIS — I10 Essential (primary) hypertension: Secondary | ICD-10-CM

## 2017-01-07 DIAGNOSIS — Z0000 Encounter for general adult medical examination without abnormal findings: Secondary | ICD-10-CM | POA: Diagnosis not present

## 2017-01-07 LAB — CBC WITH DIFFERENTIAL/PLATELET
BASOS PCT: 0.6 % (ref 0.0–3.0)
Basophils Absolute: 0 10*3/uL (ref 0.0–0.1)
EOS PCT: 2.7 % (ref 0.0–5.0)
Eosinophils Absolute: 0.2 10*3/uL (ref 0.0–0.7)
HCT: 42 % (ref 36.0–46.0)
HEMOGLOBIN: 13.8 g/dL (ref 12.0–15.0)
Lymphocytes Relative: 46.3 % — ABNORMAL HIGH (ref 12.0–46.0)
Lymphs Abs: 2.9 10*3/uL (ref 0.7–4.0)
MCHC: 32.8 g/dL (ref 30.0–36.0)
MCV: 81.7 fl (ref 78.0–100.0)
MONO ABS: 0.4 10*3/uL (ref 0.1–1.0)
MONOS PCT: 6.4 % (ref 3.0–12.0)
Neutro Abs: 2.7 10*3/uL (ref 1.4–7.7)
Neutrophils Relative %: 44 % (ref 43.0–77.0)
Platelets: 294 10*3/uL (ref 150.0–400.0)
RBC: 5.14 Mil/uL — ABNORMAL HIGH (ref 3.87–5.11)
RDW: 13.6 % (ref 11.5–15.5)
WBC: 6.2 10*3/uL (ref 4.0–10.5)

## 2017-01-07 LAB — LIPID PANEL
CHOL/HDL RATIO: 4
Cholesterol: 216 mg/dL — ABNORMAL HIGH (ref 0–200)
HDL: 53.5 mg/dL (ref >39.00)
LDL CALC: 141 mg/dL — AB (ref 0–99)
NONHDL: 162.69
TRIGLYCERIDES: 109 mg/dL (ref 0.0–149.0)
VLDL: 21.8 mg/dL (ref 0.0–40.0)

## 2017-01-07 LAB — POCT URINALYSIS DIPSTICK
BILIRUBIN UA: NEGATIVE
Glucose, UA: NEGATIVE
KETONES UA: NEGATIVE
Leukocytes, UA: NEGATIVE
Nitrite, UA: NEGATIVE
Protein, UA: NEGATIVE
RBC UA: NEGATIVE
Urobilinogen, UA: NEGATIVE
pH, UA: 6.5

## 2017-01-07 LAB — HEPATIC FUNCTION PANEL
ALK PHOS: 46 U/L (ref 39–117)
ALT: 15 U/L (ref 0–35)
AST: 22 U/L (ref 0–37)
Albumin: 4.6 g/dL (ref 3.5–5.2)
BILIRUBIN DIRECT: 0.1 mg/dL (ref 0.0–0.3)
BILIRUBIN TOTAL: 0.6 mg/dL (ref 0.2–1.2)
TOTAL PROTEIN: 8.1 g/dL (ref 6.0–8.3)

## 2017-01-07 LAB — BASIC METABOLIC PANEL
BUN: 8 mg/dL (ref 6–23)
CALCIUM: 10.3 mg/dL (ref 8.4–10.5)
CO2: 31 meq/L (ref 19–32)
CREATININE: 0.76 mg/dL (ref 0.40–1.20)
Chloride: 99 mEq/L (ref 96–112)
GFR: 98.36 mL/min (ref >60.00)
Glucose, Bld: 91 mg/dL (ref 70–99)
Potassium: 3.7 mEq/L (ref 3.5–5.1)
SODIUM: 137 meq/L (ref 135–145)

## 2017-01-07 LAB — TSH: TSH: 0.32 u[IU]/mL — ABNORMAL LOW (ref 0.35–4.50)

## 2017-01-07 NOTE — Assessment & Plan Note (Addendum)
We discussed age appropriate health related issues, including available/recomended screening tests and vaccinations. We discussed a need for adhering to healthy diet and exercise. Labs ordered. All questions were answered. Colon due in 2027 Ophth exam Mammo and PAP and BDS - pending w/gyn

## 2017-01-07 NOTE — Assessment & Plan Note (Signed)
Triamt/HCT 

## 2017-01-07 NOTE — Progress Notes (Signed)
Pre-visit discussion using our clinic review tool. No additional management support is needed unless otherwise documented below in the visit note.  

## 2017-01-07 NOTE — Progress Notes (Signed)
Subjective:  Patient ID: Dana Long, female    DOB: May 30, 1951  Age: 66 y.o. MRN: [id number]  CC: Annual Exam   HPI Dana Long presents for a well exam  Outpatient Medications Prior to Visit  Medication Sig Dispense Refill  . fluticasone (FLONASE) 50 MCG/ACT nasal spray USE 2 SPRAYS INTO THE NOSE EVERY DAY 16 g 6  . ipratropium (ATROVENT) 0.06 % nasal spray SPRAY 2 SPRAYS INTO EACH NOSTRIL 3 TIMES A DAY 15 mL 2  . triamterene-hydrochlorothiazide (MAXZIDE-25) 37.5-25 MG tablet Take 1 tablet by mouth daily. 90 tablet 3  . neomycin-polymyxin-hydrocortisone (CORTISPORIN) 3.5-10000-1 otic suspension PLACE 3 DROPS IN RIGHT EAR 3 TIMES A DAY 10 mL 1  . Calcium-Phosphorus-Vitamin D (CITRACAL +D3 PO) Take 1 each by mouth daily. Reported on 02/21/2016    . cholecalciferol (VITAMIN D) 1000 UNITS tablet Take 1,000 Units by mouth daily.    Marland Kitchen. loratadine (CLARITIN) 10 MG tablet Take 1 tablet (10 mg total) by mouth daily. 100 tablet 3   No facility-administered medications prior to visit.     ROS Review of Systems  Constitutional: Negative for activity change, appetite change, chills, fatigue and unexpected weight change.  HENT: Negative for congestion, mouth sores and sinus pressure.   Eyes: Negative for visual disturbance.  Respiratory: Negative for cough and chest tightness.   Gastrointestinal: Negative for abdominal pain and nausea.  Genitourinary: Negative for difficulty urinating, frequency and vaginal pain.  Musculoskeletal: Negative for back pain and gait problem.  Skin: Negative for pallor and rash.  Neurological: Negative for dizziness, tremors, weakness, numbness and headaches.  Psychiatric/Behavioral: Negative for confusion and sleep disturbance.    Objective:  BP 116/76   Pulse 84   Temp 97.7 F (36.5 C) (Oral)   Resp 16   Ht 5\' 1"  (1.549 m)   Wt 145 lb 4 oz (65.9 kg)   SpO2 98%   BMI 27.44 kg/m   BP Readings from Last 3 Encounters:  01/07/17 116/76  03/20/16  (!) 143/86  03/06/16 (!) 145/83    Wt Readings from Last 3 Encounters:  01/07/17 145 lb 4 oz (65.9 kg)  03/20/16 151 lb (68.5 kg)  03/06/16 152 lb (68.9 kg)    Physical Exam  Constitutional: She appears well-developed. No distress.  HENT:  Head: Normocephalic.  Right Ear: External ear normal.  Left Ear: External ear normal.  Nose: Nose normal.  Mouth/Throat: Oropharynx is clear and moist.  Eyes: Conjunctivae are normal. Pupils are equal, round, and reactive to light. Right eye exhibits no discharge. Left eye exhibits no discharge.  Neck: Normal range of motion. Neck supple. No JVD present. No tracheal deviation present. No thyromegaly present.  Cardiovascular: Normal rate, regular rhythm and normal heart sounds.   Pulmonary/Chest: No stridor. No respiratory distress. She has no wheezes.  Abdominal: Soft. Bowel sounds are normal. She exhibits no distension and no mass. There is no tenderness. There is no rebound and no guarding.  Musculoskeletal: She exhibits no edema or tenderness.  Lymphadenopathy:    She has no cervical adenopathy.  Neurological: She displays normal reflexes. No cranial nerve deficit. She exhibits normal muscle tone. Coordination normal.  Skin: No rash noted. No erythema.  Psychiatric: She has a normal mood and affect. Her behavior is normal. Judgment and thought content normal.    Lab Results  Component Value Date   WBC 5.8 06/21/2015   HGB 13.7 06/21/2015   HCT 41.8 06/21/2015   PLT 274.0 06/21/2015   GLUCOSE 95 01/05/2016  CHOL 206 (H) 11/12/2014   TRIG 146.0 11/12/2014   HDL 47.40 11/12/2014   LDLCALC 129 (H) 11/12/2014   ALT 15 01/05/2016   AST 20 01/05/2016   NA 138 01/05/2016   K 3.4 (L) 01/05/2016   CL 98 01/05/2016   CREATININE 0.80 01/05/2016   BUN 9 01/05/2016   CO2 32 01/05/2016   TSH 0.41 02/16/2015   INR 1.12 03/21/2015    Dg Chest 2 View  Result Date: 07/11/2015 CLINICAL DATA:  TB screening for implant hypertension. EXAM:  CHEST  2 VIEW COMPARISON:  11/14/2012 FINDINGS: Lungs are adequately inflated without consolidation or effusion. Cardiomediastinal silhouette is within normal. There is minimal calcified plaque over the aortic arch. There are mild degenerate changes of the spine. IMPRESSION: No active cardiopulmonary disease. Electronically Signed   By: Elberta Fortis M.D.   On: 07/11/2015 15:37    Assessment & Plan:   Dana Long was seen today for annual exam.  Diagnoses and all orders for this visit:  Annual physical exam -     POCT Urinalysis Dipstick   I have discontinued Dana Long's loratadine, cholecalciferol, and neomycin-polymyxin-hydrocortisone. I am also having her maintain her Calcium-Phosphorus-Vitamin D (CITRACAL +D3 PO), triamterene-hydrochlorothiazide, ipratropium, and fluticasone.  No orders of the defined types were placed in this encounter.    Follow-up: No Follow-up on file.  Sonda Primes, MD

## 2017-01-08 ENCOUNTER — Encounter: Payer: Self-pay | Admitting: Internal Medicine

## 2017-01-08 NOTE — Progress Notes (Signed)
Result abstracted 

## 2017-01-10 ENCOUNTER — Telehealth: Payer: Self-pay

## 2017-01-10 NOTE — Telephone Encounter (Signed)
Routing to javier, please handle, thanks 

## 2017-01-10 NOTE — Telephone Encounter (Signed)
Patient notified of labs and express understanding

## 2017-01-23 ENCOUNTER — Telehealth: Payer: Self-pay | Admitting: Internal Medicine

## 2017-01-23 NOTE — Telephone Encounter (Signed)
Error

## 2017-02-06 ENCOUNTER — Other Ambulatory Visit: Payer: Self-pay | Admitting: Internal Medicine

## 2017-04-08 ENCOUNTER — Ambulatory Visit (INDEPENDENT_AMBULATORY_CARE_PROVIDER_SITE_OTHER): Payer: BLUE CROSS/BLUE SHIELD | Admitting: Internal Medicine

## 2017-04-08 ENCOUNTER — Other Ambulatory Visit (INDEPENDENT_AMBULATORY_CARE_PROVIDER_SITE_OTHER): Payer: BLUE CROSS/BLUE SHIELD

## 2017-04-08 ENCOUNTER — Encounter: Payer: Self-pay | Admitting: Internal Medicine

## 2017-04-08 DIAGNOSIS — R7989 Other specified abnormal findings of blood chemistry: Secondary | ICD-10-CM

## 2017-04-08 DIAGNOSIS — I10 Essential (primary) hypertension: Secondary | ICD-10-CM

## 2017-04-08 DIAGNOSIS — R946 Abnormal results of thyroid function studies: Secondary | ICD-10-CM | POA: Diagnosis not present

## 2017-04-08 DIAGNOSIS — R945 Abnormal results of liver function studies: Secondary | ICD-10-CM

## 2017-04-08 LAB — BASIC METABOLIC PANEL
BUN: 8 mg/dL (ref 6–23)
CHLORIDE: 99 meq/L (ref 96–112)
CO2: 32 meq/L (ref 19–32)
Calcium: 10.8 mg/dL — ABNORMAL HIGH (ref 8.4–10.5)
Creatinine, Ser: 0.79 mg/dL (ref 0.40–1.20)
GFR: 93.99 mL/min (ref >60.00)
GLUCOSE: 91 mg/dL (ref 70–99)
POTASSIUM: 3.6 meq/L (ref 3.5–5.1)
SODIUM: 137 meq/L (ref 135–145)

## 2017-04-08 LAB — HEPATIC FUNCTION PANEL
ALK PHOS: 47 U/L (ref 39–117)
ALT: 21 U/L (ref 0–35)
AST: 24 U/L (ref 0–37)
Albumin: 4.8 g/dL (ref 3.5–5.2)
BILIRUBIN DIRECT: 0.1 mg/dL (ref 0.0–0.3)
BILIRUBIN TOTAL: 0.5 mg/dL (ref 0.2–1.2)
Total Protein: 8.2 g/dL (ref 6.0–8.3)

## 2017-04-08 LAB — TSH: TSH: 0.25 u[IU]/mL — AB (ref 0.35–4.50)

## 2017-04-08 LAB — T4, FREE: Free T4: 0.83 ng/dL (ref 0.60–1.60)

## 2017-04-08 MED ORDER — CLOTRIMAZOLE-BETAMETHASONE 1-0.05 % EX CREA: 1.0000 "application " | TOPICAL_CREAM | Freq: Two times a day (BID) | CUTANEOUS | 2 refills | Status: AC

## 2017-04-08 NOTE — Assessment & Plan Note (Signed)
Remote - check LFT

## 2017-04-08 NOTE — Assessment & Plan Note (Signed)
Triamt/HCT Labs

## 2017-04-08 NOTE — Progress Notes (Signed)
Subjective:  Patient ID: Dana Long, female    DOB: [birth year]  Age: 66 y.o. MRN: [id number]  CC: No chief complaint on file.   HPI Dana Long presents for HTN and abn TSH f/u  Outpatient Medications Prior to Visit  Medication Sig Dispense Refill  . Calcium-Phosphorus-Vitamin D (CITRACAL +D3 PO) Take 1 each by mouth daily. Reported on 02/21/2016    . fluticasone (FLONASE) 50 MCG/ACT nasal spray USE 2 SPRAYS INTO THE NOSE EVERY DAY 16 g 6  . ipratropium (ATROVENT) 0.06 % nasal spray SPRAY 2 SPRAYS INTO EACH NOSTRIL 3 TIMES A DAY 15 mL 2  . triamterene-hydrochlorothiazide (MAXZIDE-25) 37.5-25 MG tablet TAKE 1 TABLET BY MOUTH EVERY DAY 90 tablet 3   No facility-administered medications prior to visit.     ROS Review of Systems  Constitutional: Negative for activity change, appetite change, chills, fatigue and unexpected weight change.  HENT: Negative for congestion, mouth sores and sinus pressure.   Eyes: Negative for visual disturbance.  Respiratory: Negative for cough and chest tightness.   Gastrointestinal: Negative for abdominal pain and nausea.  Genitourinary: Negative for difficulty urinating, frequency and vaginal pain.  Musculoskeletal: Negative for back pain and gait problem.  Skin: Negative for pallor and rash.  Neurological: Negative for dizziness, tremors, weakness, numbness and headaches.  Psychiatric/Behavioral: Negative for confusion and sleep disturbance.    Objective:  BP 128/80 (BP Location: Right Arm, Patient Position: Sitting, Cuff Size: Normal)   Pulse 90   Temp 98.3 F (36.8 C) (Oral)   Ht 5\' 1"  (1.549 m)   Wt 142 lb 1.3 oz (64.4 kg)   SpO2 99%   BMI 26.85 kg/m   BP Readings from Last 3 Encounters:  04/08/17 128/80  01/07/17 116/76  03/20/16 (!) 143/86    Wt Readings from Last 3 Encounters:  04/08/17 142 lb 1.3 oz (64.4 kg)  01/07/17 145 lb 4 oz (65.9 kg)  03/20/16 151 lb (68.5 kg)    Physical Exam  Constitutional: She appears  well-developed. No distress.  HENT:  Head: Normocephalic.  Right Ear: External ear normal.  Left Ear: External ear normal.  Nose: Nose normal.  Mouth/Throat: Oropharynx is clear and moist.  Eyes: Conjunctivae are normal. Pupils are equal, round, and reactive to light. Right eye exhibits no discharge. Left eye exhibits no discharge.  Neck: Normal range of motion. Neck supple. No JVD present. No tracheal deviation present. No thyromegaly present.  Cardiovascular: Normal rate, regular rhythm and normal heart sounds.   Pulmonary/Chest: No stridor. No respiratory distress. She has no wheezes.  Abdominal: Soft. Bowel sounds are normal. She exhibits no distension and no mass. There is no tenderness. There is no rebound and no guarding.  Musculoskeletal: She exhibits no edema or tenderness.  Lymphadenopathy:    She has no cervical adenopathy.  Neurological: She displays normal reflexes. No cranial nerve deficit. She exhibits normal muscle tone. Coordination normal.  Skin: No rash noted. No erythema.  Psychiatric: She has a normal mood and affect. Her behavior is normal. Judgment and thought content normal.    Lab Results  Component Value Date   WBC 6.2 01/07/2017   HGB 13.8 01/07/2017   HCT 42.0 01/07/2017   PLT 294.0 01/07/2017   GLUCOSE 91 01/07/2017   CHOL 216 (H) 01/07/2017   TRIG 109.0 01/07/2017   HDL 53.50 01/07/2017   LDLCALC 141 (H) 01/07/2017   ALT 15 01/07/2017   AST 22 01/07/2017   NA 137 01/07/2017   K 3.7 01/07/2017  CL 99 01/07/2017   CREATININE 0.76 01/07/2017   BUN 8 01/07/2017   CO2 31 01/07/2017   TSH 0.32 (L) 01/07/2017   INR 1.12 03/21/2015    Dg Chest 2 View  Result Date: 07/11/2015 CLINICAL DATA:  TB screening for implant hypertension. EXAM: CHEST  2 VIEW COMPARISON:  11/14/2012 FINDINGS: Lungs are adequately inflated without consolidation or effusion. Cardiomediastinal silhouette is within normal. There is minimal calcified plaque over the aortic arch.  There are mild degenerate changes of the spine. IMPRESSION: No active cardiopulmonary disease. Electronically Signed   By: Elberta Fortisaniel  Boyle M.D.   On: 07/11/2015 15:37    Assessment & Plan:   There are no diagnoses linked to this encounter. I am having Ms. Dana Long maintain her Calcium-Phosphorus-Vitamin D (CITRACAL +D3 PO), ipratropium, fluticasone, and triamterene-hydrochlorothiazide.  No orders of the defined types were placed in this encounter.    Follow-up: No Follow-up on file.  Sonda PrimesAlex Plotnikov, MD

## 2017-04-08 NOTE — Assessment & Plan Note (Signed)
Check TSH 

## 2017-05-06 ENCOUNTER — Other Ambulatory Visit: Payer: Self-pay | Admitting: Internal Medicine

## 2017-05-06 NOTE — Telephone Encounter (Signed)
Rec'd fax pt requesting refill on promethazine cough syrup...Raechel Chute/lmb

## 2017-05-08 NOTE — Telephone Encounter (Signed)
Pt called regarding her Cough syrup

## 2017-05-08 NOTE — Telephone Encounter (Signed)
OK to fill x 1. Thx.

## 2017-05-09 NOTE — Telephone Encounter (Signed)
Printed script, MD signed, notified pt rx ready for pick-up.../lmb 

## 2017-07-24 ENCOUNTER — Other Ambulatory Visit: Payer: Self-pay | Admitting: Internal Medicine

## 2017-07-24 DIAGNOSIS — Z23 Encounter for immunization: Secondary | ICD-10-CM | POA: Diagnosis not present

## 2017-09-23 DIAGNOSIS — H2513 Age-related nuclear cataract, bilateral: Secondary | ICD-10-CM | POA: Diagnosis not present

## 2017-10-04 ENCOUNTER — Ambulatory Visit: Payer: BLUE CROSS/BLUE SHIELD | Admitting: Internal Medicine

## 2017-12-22 ENCOUNTER — Emergency Department (HOSPITAL_COMMUNITY): Payer: Medicare Other

## 2017-12-22 ENCOUNTER — Encounter (HOSPITAL_COMMUNITY): Payer: Self-pay | Admitting: Emergency Medicine

## 2017-12-22 ENCOUNTER — Emergency Department (HOSPITAL_COMMUNITY)
Admission: EM | Admit: 2017-12-22 | Discharge: 2017-12-22 | Disposition: A | Discharge status: Home / Self Care | Payer: Medicare Other | Attending: Emergency Medicine | Admitting: Emergency Medicine

## 2017-12-22 DIAGNOSIS — L03019 Cellulitis of unspecified finger: Secondary | ICD-10-CM

## 2017-12-22 DIAGNOSIS — I10 Essential (primary) hypertension: Secondary | ICD-10-CM | POA: Diagnosis not present

## 2017-12-22 DIAGNOSIS — L03011 Cellulitis of right finger: Secondary | ICD-10-CM | POA: Insufficient documentation

## 2017-12-22 DIAGNOSIS — Z79899 Other long term (current) drug therapy: Secondary | ICD-10-CM | POA: Diagnosis not present

## 2017-12-22 DIAGNOSIS — Z87891 Personal history of nicotine dependence: Secondary | ICD-10-CM | POA: Diagnosis not present

## 2017-12-22 DIAGNOSIS — M79644 Pain in right finger(s): Secondary | ICD-10-CM | POA: Diagnosis present

## 2017-12-22 DIAGNOSIS — S60011A Contusion of right thumb without damage to nail, initial encounter: Secondary | ICD-10-CM | POA: Diagnosis not present

## 2017-12-22 MED ORDER — HYDROCODONE-ACETAMINOPHEN 5-325 MG PO TABS
1.0000 | ORAL_TABLET | Freq: Once | ORAL | Status: AC
Start: 2017-12-22 — End: 2017-12-22
  Administered 2017-12-22: 1 via ORAL
  Filled 2017-12-22: qty 1

## 2017-12-22 MED ORDER — BUPIVACAINE HCL (PF) 0.5 % IJ SOLN
20.0000 mL | Freq: Once | INTRAMUSCULAR | Status: AC
  Administered 2017-12-22: 20 mL
  Filled 2017-12-22: qty 30

## 2017-12-22 MED ORDER — SULFAMETHOXAZOLE-TRIMETHOPRIM 800-160 MG PO TABS: 1.0000 | ORAL_TABLET | Freq: Two times a day (BID) | ORAL | 0 refills | Status: AC

## 2017-12-22 MED ORDER — TETANUS-DIPHTH-ACELL PERTUSSIS 5-2.5-18.5 LF-MCG/0.5 IM SUSP
0.5000 mL | Freq: Once | INTRAMUSCULAR | Status: AC
  Administered 2017-12-22: 0.5 mL via INTRAMUSCULAR
  Filled 2017-12-22: qty 0.5

## 2017-12-22 NOTE — ED Notes (Signed)
Bed: WTR7 Expected date:  Expected time:  Means of arrival:  Comments: 

## 2017-12-22 NOTE — ED Provider Notes (Signed)
[facility]-EMERGENCY DEPT Provider Note   CSN: [id number] Arrival date & time: 12/22/17  1041     History   Chief Complaint Chief Complaint  Patient presents with  . Hand Pain    HPI Dana Long is a 67 y.o. female with past medical history significant for hypertension presenting with right thumb erythema and edema with pain of progressive onset 48 hours ago.  She reports being her thumb on the door on Friday before the progression of edema and swelling.  Has not taken anything for her symptoms today.  Days that the pain has become worse today.  Systemic symptoms, no fever, chills, nausea, vomiting.  She is otherwise in her usual state of health. Tdap was greater than 5 years ago.  HPI  Past Medical History:  Diagnosis Date  . GERD (gastroesophageal reflux disease)   . Gout    ?  Marland Kitchen Hypertension     Patient Active Problem List   Diagnosis Date Noted  . Abnormal TSH 04/08/2017  . Liver fibrosis 03/20/2016  . Hep C w/o coma, chronic (HCC) 02/19/2015  . Frequent urination 02/16/2015  . Elevated LFTs 11/17/2014  . LBP (low back pain) 11/11/2013  . Well adult exam 11/14/2012  . 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 Allergic rhinitis, seasonal 04/25/2011  . Headache(784.0) 04/02/2011  . Sinusitis, acute 04/02/2011  . CERUMEN IMPACTION 01/23/2011  . Essential hypertension 01/23/2011  . GERD 01/23/2011  . ANKLE PAIN 01/23/2011  . PARESTHESIA 01/23/2011    Past Surgical History:  Procedure Laterality Date  . TUBAL LIGATION      OB History    No data available       Home Medications    Prior to Admission medications   Medication Sig Start Date End Date Taking? Authorizing Provider  Calcium-Phosphorus-Vitamin D (CITRACAL +D3 PO) Take 1 each by mouth daily. Reported on 02/21/2016    [provider]  clotrimazole-betamethasone (LOTRISONE) cream Apply 1 application topically 2 (two) times daily. 04/08/17 04/08/18  Plotnikov, Georgina Quint, MD    fluticasone (FLONASE) 50 MCG/ACT nasal spray USE 2 SPRAYS INTO THE NOSE EVERY DAY 07/24/17   Plotnikov, Georgina Quint, MD  ipratropium (ATROVENT) 0.06 % nasal spray USE 2 SPRAYS INTO EACH NOSTRIL 3 TIMES A DAY 05/06/17   Plotnikov, Georgina Quint, MD  promethazine-codeine (PHENERGAN WITH CODEINE) 6.25-10 MG/5ML syrup TAKE BY MOUTH EVERY 4 HOURS AS NEEDED 05/09/17   Plotnikov, Georgina Quint, MD  sulfamethoxazole-trimethoprim (BACTRIM DS,SEPTRA DS) 800-160 MG tablet Take 1 tablet by mouth 2 (two) times daily for 10 days. 12/22/17 01/01/18  Georgiana Shore, PA-C  triamterene-hydrochlorothiazide (MAXZIDE-25) 37.5-25 MG tablet TAKE 1 TABLET BY MOUTH EVERY DAY 02/06/17   Plotnikov, Georgina Quint, MD    Family History Family History  Problem Relation Age of Onset  . Gout Father   . Diabetes Mother   . Diabetes Sister   . Diabetes Brother   . Diabetes Other   . Hypertension Other   . Colon cancer Neg Hx     Social History Social History   Tobacco Use  . Smoking status: Former Games developer  . Smokeless tobacco: Never Used  Substance Use Topics  . Alcohol use: No    Alcohol/week: 0.0 oz  . Drug use: No     Allergies   Patient has no known allergies.   Review of Systems Review of Systems  Constitutional: Negative for chills and fever.  Gastrointestinal: Negative for nausea and vomiting.  Musculoskeletal: Positive for arthralgias and  myalgias. Negative for neck pain and neck stiffness.  Skin: Positive for color change. Negative for pallor, rash and wound.  Neurological: Negative for weakness and numbness.     Physical Exam Updated Vital Signs BP (!) 156/80 (BP Location: Right Arm)   Pulse 98   Temp 98 F (36.7 C) (Oral)   Resp 17   SpO2 98%   Physical Exam  Constitutional: She appears well-developed and well-nourished. No distress.  Afebrile, nontoxic-appearing, sitting comfortably in chair in no acute distress.  HENT:  Head: Normocephalic and atraumatic.  Eyes: Conjunctivae are normal.  Right eye exhibits no discharge. Left eye exhibits no discharge.  Neck: Normal range of motion.  Cardiovascular: Normal rate, regular rhythm, normal heart sounds and intact distal pulses.  No murmur heard. Pulmonary/Chest: Effort normal and breath sounds normal. No stridor. No respiratory distress. She has no wheezes. She has no rales.  Musculoskeletal: Normal range of motion. She exhibits edema and tenderness.  Full range of motion of the thumb.  Edema to the pulp of the finger and paronychia that may have progressed to  [facility].  Neurological: She is alert. No sensory deficit.  Neurovascularly intact.  5 out of 5 strength to flexion and extension of the DIP  Skin: Skin is warm and dry. Capillary refill takes less than 2 seconds. No rash noted. She is not diaphoretic. There is erythema. No pallor.  Psychiatric: She has a normal mood and affect.  Nursing note and vitals reviewed.    ED Treatments / Results  Labs (all labs ordered are listed, but only abnormal results are displayed) Labs Reviewed - No data to display  EKG  EKG Interpretation None       Radiology Dg Finger Thumb Right  Result Date: 12/22/2017 CLINICAL DATA:  Right thumb pain with swelling and bruising. EXAM: RIGHT THUMB 2+V COMPARISON:  None. FINDINGS: There is no evidence of fracture or dislocation. There is no evidence of arthropathy or other focal bone abnormality. Soft tissues are unremarkable IMPRESSION: Negative. Electronically Signed   By: Kennith CenterEric  Mansell M.D.   On: 12/22/2017 13:10    Procedures Procedures (including critical care time) NERVE BLOCK Performed by: Georgiana ShoreJessica B Mohit Zirbes Consent: Verbal consent obtained. Required items: required blood products, implants, devices, and special equipment available Time out: Immediately prior to procedure a "time out" was called to verify the correct patient, procedure, equipment, support staff and site/side marked as required.  Indication: Paronychia and felon of the  right thumb Nerve block body site: Right thumb  Preparation: Patient was prepped and draped in the usual sterile fashion. Needle gauge: 23 G Location technique: anatomical landmarks  Local anesthetic: Marcaine 0.5%  Anesthetic total: 4 ml  Outcome: pain improved Patient tolerance: Patient tolerated the procedure well with no immediate complications.  INCISION AND DRAINAGE Performed by: Georgiana ShoreJessica B Nadelyn Enriques Consent: Verbal consent obtained. Risks and benefits: risks, benefits and alternatives were discussed Type: abscess  Body area: Lateral right thumb paronychia  Anesthesia: local infiltration with digital block  Incision was made with a 18-gauge needle  Local anesthetic: Marcaine  Complexity: Simple  Drainage: purulent  Drainage amount: 1 mL  Packing material: N/A  Patient tolerance: Patient tolerated the procedure well with no immediate complications.  INCISION AND DRAINAGE Performed by: Georgiana ShoreJessica B Akeila Lana Consent: Verbal consent obtained. Risks and benefits: risks, benefits and alternatives were discussed Type: abscess  Body area: Pulp of the right thumb  Anesthesia: local infiltration digital block  Incision was made with a scalpel.  Local  anesthetic: Marcaine Anesthetic total: 4 mL  Complexity: complex Blunt dissection to break up loculations  Drainage: purulent/blood  Drainage amount: 3 mL  Packing material: 1/4 in iodoform gauze  Patient tolerance: Patient tolerated the procedure well with no immediate complications.  SPLINT APPLICATION Date/Time: 4:02 PM Authorized by: Georgiana Shore Consent: Verbal consent obtained. Risks and benefits: risks, benefits and alternatives were discussed Consent given by: patient Splint applied by: orthopedic technician Location details: right thumb Splint type: finger Supplies used: static finger splint Post-procedure: The splinted body part was neurovascularly unchanged following the  procedure. Patient tolerance: Patient tolerated the procedure well with no immediate complications.    Medications Ordered in ED Medications  bupivacaine (MARCAINE) 0.5 % injection 20 mL (20 mLs Infiltration Given 12/22/17 1249)  Tdap (BOOSTRIX) injection 0.5 mL (0.5 mLs Intramuscular Given 12/22/17 1250)  HYDROcodone-acetaminophen (NORCO/VICODIN) 5-325 MG per tablet 1 tablet (1 tablet Oral Given 12/22/17 1249)     Initial Impression / Assessment and Plan / ED Course  I have reviewed the triage vital signs and the nursing notes.  Pertinent labs & imaging results that were available during my care of the patient were reviewed by me and considered in my medical decision making (see chart for details).    Patient with paronychia and felon of the right thumb amenable to incision and drainage. large enough to warrant packing or drain,  wound recheck in 2 days. Encouraged home warm soaks and flushing after packing is removed.  Mild signs of cellulitis is surrounding skin.  Will d/c to home.    Patient will be discharged home with Bactrim with return in 24-48 hours for packing removal and wound recheck.  She was otherwise well-appearing, nontoxic afebrile hemodynamically stable.  Discussed strict return precautions and advised to return to the emergency department if experiencing any new or worsening symptoms. Instructions were understood and patient agreed with discharge plan.  Final Clinical Impressions(s) / ED Diagnoses   Final diagnoses:  Paronychia of finger, right  Felon of finger    ED Discharge Orders        Ordered    sulfamethoxazole-trimethoprim (BACTRIM DS,SEPTRA DS) 800-160 MG tablet  2 times daily     12/22/17 1458       Gregary Cromer 12/22/17 1603    Lorre Nick, MD 12/23/17 [id number]

## 2017-12-22 NOTE — ED Triage Notes (Signed)
Patient here from home with complaints of right thumb pain that started Friday.  Reports redness and swelling to right thumb near nailbed. Pain 10/10.

## 2017-12-22 NOTE — Discharge Instructions (Signed)
As discussed, have the wound rechecked in 24-48 hours and for removal of packing. Tylenol for pain.  You may take 1000 mg every 6 hours as needed for severe pain.  Not exceed 4000 mg of acetaminophen-containing products in 24-hour period. You will need to start warm soaks after the packing is removed. Take your entire course of antibiotics even if you feel better.  Make sure that you stay well-hydrated and follow-up with your primary care provider.

## 2017-12-23 ENCOUNTER — Inpatient Hospital Stay: Payer: Medicare Other | Admitting: Internal Medicine

## 2017-12-24 ENCOUNTER — Ambulatory Visit (INDEPENDENT_AMBULATORY_CARE_PROVIDER_SITE_OTHER): Payer: Medicare Other | Admitting: Internal Medicine

## 2017-12-24 ENCOUNTER — Encounter: Payer: Self-pay | Admitting: Internal Medicine

## 2017-12-24 DIAGNOSIS — L02511 Cutaneous abscess of right hand: Secondary | ICD-10-CM | POA: Diagnosis not present

## 2017-12-24 DIAGNOSIS — I10 Essential (primary) hypertension: Secondary | ICD-10-CM

## 2017-12-24 MED ORDER — SILVER SULFADIAZINE 1 % EX CREA: 1.0000 "application " | TOPICAL_CREAM | Freq: Every day | CUTANEOUS | 0 refills | Status: DC

## 2017-12-24 NOTE — Patient Instructions (Signed)
Change dressing daily

## 2017-12-24 NOTE — Progress Notes (Signed)
Subjective:  Patient ID: Dana Long, female    DOB: 08-24-1951  Age: 67 y.o. MRN: [id number]  CC: No chief complaint on file.   HPI Giliana Baria presents for post emergency room visit for a right thumb abscess incision and drainage checkup.  Follow-up of hypertension  Outpatient Medications Prior to Visit  Medication Sig Dispense Refill  . Calcium-Phosphorus-Vitamin D (CITRACAL +D3 PO) Take 1 each by mouth daily. Reported on 02/21/2016    . clotrimazole-betamethasone (LOTRISONE) cream Apply 1 application topically 2 (two) times daily. 30 g 2  . fluticasone (FLONASE) 50 MCG/ACT nasal spray USE 2 SPRAYS INTO THE NOSE EVERY DAY 16 g 11  . ipratropium (ATROVENT) 0.06 % nasal spray USE 2 SPRAYS INTO EACH NOSTRIL 3 TIMES A DAY 15 mL 2  . promethazine-codeine (PHENERGAN WITH CODEINE) 6.25-10 MG/5ML syrup TAKE BY MOUTH EVERY 4 HOURS AS NEEDED 300 mL 1  . sulfamethoxazole-trimethoprim (BACTRIM DS,SEPTRA DS) 800-160 MG tablet Take 1 tablet by mouth 2 (two) times daily for 10 days. 20 tablet 0  . triamterene-hydrochlorothiazide (MAXZIDE-25) 37.5-25 MG tablet TAKE 1 TABLET BY MOUTH EVERY DAY 90 tablet 3   No facility-administered medications prior to visit.     ROS Review of Systems  Constitutional: Negative for activity change, appetite change, chills, fatigue, fever and unexpected weight change.  HENT: Negative for congestion, mouth sores and sinus pressure.   Eyes: Negative for visual disturbance.  Respiratory: Negative for cough and chest tightness.   Gastrointestinal: Negative for abdominal pain and nausea.  Genitourinary: Negative for difficulty urinating, frequency and vaginal pain.  Musculoskeletal: Negative for back pain and gait problem.  Skin: Positive for wound. Negative for pallor and rash.  Neurological: Negative for dizziness, tremors, weakness, numbness and headaches.  Psychiatric/Behavioral: Negative for confusion and sleep disturbance.    Objective:  BP 136/84  (BP Location: Left Arm, Patient Position: Sitting, Cuff Size: Large)   Pulse 88   Temp 98.3 F (36.8 C) (Oral)   Ht 5\' 1"  (1.549 m)   Wt 160 lb (72.6 kg)   SpO2 99%   BMI 30.23 kg/m   BP Readings from Last 3 Encounters:  12/24/17 136/84  12/22/17 (!) 156/80  04/08/17 128/80    Wt Readings from Last 3 Encounters:  12/24/17 160 lb (72.6 kg)  04/08/17 142 lb 1.3 oz (64.4 kg)  01/07/17 145 lb 4 oz (65.9 kg)    Physical Exam  Constitutional: She appears well-developed. No distress.  HENT:  Head: Normocephalic.  Right Ear: External ear normal.  Left Ear: External ear normal.  Nose: Nose normal.  Mouth/Throat: Oropharynx is clear and moist.  Eyes: Conjunctivae are normal. Pupils are equal, round, and reactive to light. Right eye exhibits no discharge. Left eye exhibits no discharge.  Neck: Normal range of motion. Neck supple. No JVD present. No tracheal deviation present. No thyromegaly present.  Cardiovascular: Normal rate, regular rhythm and normal heart sounds.  Pulmonary/Chest: No stridor. No respiratory distress. She has no wheezes.  Abdominal: Soft. Bowel sounds are normal. She exhibits no distension and no mass. There is no tenderness. There is no rebound and no guarding.  Musculoskeletal: She exhibits no edema or tenderness.  Lymphadenopathy:    She has no cervical adenopathy.  Neurological: She displays normal reflexes. No cranial nerve deficit. She exhibits normal muscle tone. Coordination normal.  Skin: No rash noted. No erythema.  Psychiatric: She has a normal mood and affect. Her behavior is normal. Judgment and thought content normal.  R thumb abscess -the dressing was carefully removed.  1.5 cm incision on the distal phalanx was cleaned with Betadine.  The gauze packing was removed.  Silvadene cream was applied.  The wound was covered with Telfa and gauze.  Coban dressing applied.  Face-to-face time total of greater than 20 minutes  Lab Results  Component  Value Date   WBC 6.2 01/07/2017   HGB 13.8 01/07/2017   HCT 42.0 01/07/2017   PLT 294.0 01/07/2017   GLUCOSE 91 04/08/2017   CHOL 216 (H) 01/07/2017   TRIG 109.0 01/07/2017   HDL 53.50 01/07/2017   LDLCALC 141 (H) 01/07/2017   ALT 21 04/08/2017   AST 24 04/08/2017   NA 137 04/08/2017   K 3.6 04/08/2017   CL 99 04/08/2017   CREATININE 0.79 04/08/2017   BUN 8 04/08/2017   CO2 32 04/08/2017   TSH 0.25 (L) 04/08/2017   INR 1.12 03/21/2015    Dg Finger Thumb Right  Result Date: 12/22/2017 CLINICAL DATA:  Right thumb pain with swelling and bruising. EXAM: RIGHT THUMB 2+V COMPARISON:  None. FINDINGS: There is no evidence of fracture or dislocation. There is no evidence of arthropathy or other focal bone abnormality. Soft tissues are unremarkable IMPRESSION: Negative. Electronically Signed   By: Kennith CenterEric  Mansell M.D.   On: 12/22/2017 13:10    Assessment & Plan:   There are no diagnoses linked to this encounter. I am having Jossie Darcella Gasmanodson maintain her Calcium-Phosphorus-Vitamin D (CITRACAL +D3 PO), triamterene-hydrochlorothiazide, clotrimazole-betamethasone, ipratropium, promethazine-codeine, fluticasone, and sulfamethoxazole-trimethoprim.  No orders of the defined types were placed in this encounter.    Follow-up: No Follow-up on file.  Sonda PrimesAlex Plotnikov, MD

## 2017-12-29 ENCOUNTER — Encounter: Payer: Self-pay | Admitting: Internal Medicine

## 2017-12-29 DIAGNOSIS — L02511 Cutaneous abscess of right hand: Secondary | ICD-10-CM | POA: Insufficient documentation

## 2017-12-29 NOTE — Assessment & Plan Note (Signed)
Continue with Maxide

## 2017-12-29 NOTE — Assessment & Plan Note (Signed)
R thumb abscess -the dressing was carefully removed.  1.5 cm incision on the distal phalanx was cleaned with Betadine.  The gauze packing was removed.  Silvadene cream was applied.  The wound was covered with Telfa and gauze.  Coban dressing applied.  Face-to-face time total of greater than 20 minutes

## 2018-01-08 ENCOUNTER — Other Ambulatory Visit (INDEPENDENT_AMBULATORY_CARE_PROVIDER_SITE_OTHER): Payer: Medicare Other

## 2018-01-08 ENCOUNTER — Ambulatory Visit (INDEPENDENT_AMBULATORY_CARE_PROVIDER_SITE_OTHER): Payer: Medicare Other | Admitting: Internal Medicine

## 2018-01-08 ENCOUNTER — Encounter: Payer: Self-pay | Admitting: Internal Medicine

## 2018-01-08 VITALS — BP 128/84 | HR 79 | Temp 98.2°F | Ht 61.0 in | Wt 155.0 lb

## 2018-01-08 DIAGNOSIS — K7400 Hepatic fibrosis, unspecified: Secondary | ICD-10-CM

## 2018-01-08 DIAGNOSIS — I10 Essential (primary) hypertension: Secondary | ICD-10-CM | POA: Diagnosis not present

## 2018-01-08 DIAGNOSIS — R7989 Other specified abnormal findings of blood chemistry: Secondary | ICD-10-CM | POA: Diagnosis not present

## 2018-01-08 DIAGNOSIS — Z0000 Encounter for general adult medical examination without abnormal findings: Secondary | ICD-10-CM | POA: Diagnosis not present

## 2018-01-08 LAB — HEPATIC FUNCTION PANEL
ALK PHOS: 47 U/L (ref 39–117)
ALT: 18 U/L (ref 0–35)
AST: 22 U/L (ref 0–37)
Albumin: 5.1 g/dL (ref 3.5–5.2)
Bilirubin, Direct: 0.1 mg/dL (ref 0.0–0.3)
TOTAL PROTEIN: 8.5 g/dL — AB (ref 6.0–8.3)
Total Bilirubin: 0.6 mg/dL (ref 0.2–1.2)

## 2018-01-08 LAB — T4, FREE: Free T4: 0.86 ng/dL (ref 0.60–1.60)

## 2018-01-08 LAB — CBC WITH DIFFERENTIAL/PLATELET
BASOS ABS: 0 10*3/uL (ref 0.0–0.1)
Basophils Relative: 0.5 % (ref 0.0–3.0)
EOS PCT: 3.4 % (ref 0.0–5.0)
Eosinophils Absolute: 0.2 10*3/uL (ref 0.0–0.7)
HEMATOCRIT: 44.5 % (ref 36.0–46.0)
HEMOGLOBIN: 14.8 g/dL (ref 12.0–15.0)
LYMPHS PCT: 41.9 % (ref 12.0–46.0)
Lymphs Abs: 2 10*3/uL (ref 0.7–4.0)
MCHC: 33.3 g/dL (ref 30.0–36.0)
MCV: 81.9 fl (ref 78.0–100.0)
Monocytes Absolute: 0.3 10*3/uL (ref 0.1–1.0)
Monocytes Relative: 6 % (ref 3.0–12.0)
NEUTROS PCT: 48.2 % (ref 43.0–77.0)
Neutro Abs: 2.2 10*3/uL (ref 1.4–7.7)
Platelets: 288 10*3/uL (ref 150.0–400.0)
RBC: 5.44 Mil/uL — ABNORMAL HIGH (ref 3.87–5.11)
RDW: 13.1 % (ref 11.5–15.5)
WBC: 4.7 10*3/uL (ref 4.0–10.5)

## 2018-01-08 LAB — BASIC METABOLIC PANEL
BUN: 11 mg/dL (ref 6–23)
CALCIUM: 10.5 mg/dL (ref 8.4–10.5)
CHLORIDE: 97 meq/L (ref 96–112)
CO2: 29 meq/L (ref 19–32)
CREATININE: 0.8 mg/dL (ref 0.40–1.20)
GFR: 92.42 mL/min (ref >60.00)
GLUCOSE: 96 mg/dL (ref 70–99)
Potassium: 3.7 mEq/L (ref 3.5–5.1)
Sodium: 136 mEq/L (ref 135–145)

## 2018-01-08 LAB — LIPID PANEL
CHOL/HDL RATIO: 5
Cholesterol: 228 mg/dL — ABNORMAL HIGH (ref 0–200)
HDL: 43.8 mg/dL (ref >39.00)
LDL CALC: 160 mg/dL — AB (ref 0–99)
NONHDL: 184.04
TRIGLYCERIDES: 122 mg/dL (ref 0.0–149.0)
VLDL: 24.4 mg/dL (ref 0.0–40.0)

## 2018-01-08 LAB — URINALYSIS
Bilirubin Urine: NEGATIVE
Hgb urine dipstick: NEGATIVE
KETONES UR: NEGATIVE
Leukocytes, UA: NEGATIVE
Nitrite: NEGATIVE
TOTAL PROTEIN, URINE-UPE24: NEGATIVE
URINE GLUCOSE: NEGATIVE
Urobilinogen, UA: 0.2 (ref 0.0–1.0)
pH: 6.5 (ref 5.0–8.0)

## 2018-01-08 LAB — TSH: TSH: 0.41 u[IU]/mL (ref 0.35–4.50)

## 2018-01-08 MED ORDER — FLUTICASONE PROPIONATE 50 MCG/ACT NA SUSP: NASAL | 11 refills | Status: DC

## 2018-01-08 MED ORDER — TRIAMTERENE-HCTZ 37.5-25 MG PO TABS: 1.0000 | ORAL_TABLET | Freq: Every day | ORAL | 3 refills | Status: DC

## 2018-01-08 NOTE — Patient Instructions (Signed)
Health Maintenance for Postmenopausal Women Menopause is a normal process in which your reproductive ability comes to an end. This process happens gradually over a span of months to years, usually between the ages of 22 and 9. Menopause is complete when you have missed 12 consecutive menstrual periods. It is important to talk with your health care provider about some of the most common conditions that affect postmenopausal women, such as heart disease, cancer, and bone loss (osteoporosis). Adopting a healthy lifestyle and getting preventive care can help to promote your health and wellness. Those actions can also lower your chances of developing some of these common conditions. What should I know about menopause? During menopause, you may experience a number of symptoms, such as:  Moderate-to-severe hot flashes.  Night sweats.  Decrease in sex drive.  Mood swings.  Headaches.  Tiredness.  Irritability.  Memory problems.  Insomnia.  Choosing to treat or not to treat menopausal changes is an individual decision that you make with your health care provider. What should I know about hormone replacement therapy and supplements? Hormone therapy products are effective for treating symptoms that are associated with menopause, such as hot flashes and night sweats. Hormone replacement carries certain risks, especially as you become older. If you are thinking about using estrogen or estrogen with progestin treatments, discuss the benefits and risks with your health care provider. What should I know about heart disease and stroke? Heart disease, heart attack, and stroke become more likely as you age. This may be due, in part, to the hormonal changes that your body experiences during menopause. These can affect how your body processes dietary fats, triglycerides, and cholesterol. Heart attack and stroke are both medical emergencies. There are many things that you can do to help prevent heart disease  and stroke:  Have your blood pressure checked at least every 1-2 years. High blood pressure causes heart disease and increases the risk of stroke.  If you are 53-22 years old, ask your health care provider if you should take aspirin to prevent a heart attack or a stroke.  Do not use any tobacco products, including cigarettes, chewing tobacco, or electronic cigarettes. If you need help quitting, ask your health care provider.  It is important to eat a healthy diet and maintain a healthy weight. ? Be sure to include plenty of vegetables, fruits, low-fat dairy products, and lean protein. ? Avoid eating foods that are high in solid fats, added sugars, or salt (sodium).  Get regular exercise. This is one of the most important things that you can do for your health. ? Try to exercise for at least 150 minutes each week. The type of exercise that you do should increase your heart rate and make you sweat. This is known as moderate-intensity exercise. ? Try to do strengthening exercises at least twice each week. Do these in addition to the moderate-intensity exercise.  Know your numbers.Ask your health care provider to check your cholesterol and your blood glucose. Continue to have your blood tested as directed by your health care provider.  What should I know about cancer screening? There are several types of cancer. Take the following steps to reduce your risk and to catch any cancer development as early as possible. Breast Cancer  Practice breast self-awareness. ? This means understanding how your breasts normally appear and feel. ? It also means doing regular breast self-exams. Let your health care provider know about any changes, no matter how small.  If you are 40  or older, have a clinician do a breast exam (clinical breast exam or CBE) every year. Depending on your age, family history, and medical history, it may be recommended that you also have a yearly breast X-ray (mammogram).  If you  have a family history of breast cancer, talk with your health care provider about genetic screening.  If you are at high risk for breast cancer, talk with your health care provider about having an MRI and a mammogram every year.  Breast cancer (BRCA) gene test is recommended for women who have family members with BRCA-related cancers. Results of the assessment will determine the need for genetic counseling and BRCA1 and for BRCA2 testing. BRCA-related cancers include these types: ? Breast. This occurs in males or females. ? Ovarian. ? Tubal. This may also be called fallopian tube cancer. ? Cancer of the abdominal or pelvic lining (peritoneal cancer). ? Prostate. ? Pancreatic.  Cervical, Uterine, and Ovarian Cancer Your health care provider may recommend that you be screened regularly for cancer of the pelvic organs. These include your ovaries, uterus, and vagina. This screening involves a pelvic exam, which includes checking for microscopic changes to the surface of your cervix (Pap test).  For women ages 21-65, health care providers may recommend a pelvic exam and a Pap test every three years. For women ages 79-65, they may recommend the Pap test and pelvic exam, combined with testing for human papilloma virus (HPV), every five years. Some types of HPV increase your risk of cervical cancer. Testing for HPV may also be done on women of any age who have unclear Pap test results.  Other health care providers may not recommend any screening for nonpregnant women who are considered low risk for pelvic cancer and have no symptoms. Ask your health care provider if a screening pelvic exam is right for you.  If you have had past treatment for cervical cancer or a condition that could lead to cancer, you need Pap tests and screening for cancer for at least 20 years after your treatment. If Pap tests have been discontinued for you, your risk factors (such as having a new sexual partner) need to be  reassessed to determine if you should start having screenings again. Some women have medical problems that increase the chance of getting cervical cancer. In these cases, your health care provider may recommend that you have screening and Pap tests more often.  If you have a family history of uterine cancer or ovarian cancer, talk with your health care provider about genetic screening.  If you have vaginal bleeding after reaching menopause, tell your health care provider.  There are currently no reliable tests available to screen for ovarian cancer.  Lung Cancer Lung cancer screening is recommended for adults 69-62 years old who are at high risk for lung cancer because of a history of smoking. A yearly low-dose CT scan of the lungs is recommended if you:  Currently smoke.  Have a history of at least 30 pack-years of smoking and you currently smoke or have quit within the past 15 years. A pack-year is smoking an average of one pack of cigarettes per day for one year.  Yearly screening should:  Continue until it has been 15 years since you quit.  Stop if you develop a health problem that would prevent you from having lung cancer treatment.  Colorectal Cancer  This type of cancer can be detected and can often be prevented.  Routine colorectal cancer screening usually begins at  age 42 and continues through age 45.  If you have risk factors for colon cancer, your health care provider may recommend that you be screened at an earlier age.  If you have a family history of colorectal cancer, talk with your health care provider about genetic screening.  Your health care provider may also recommend using home test kits to check for hidden blood in your stool.  A small camera at the end of a tube can be used to examine your colon directly (sigmoidoscopy or colonoscopy). This is done to check for the earliest forms of colorectal cancer.  Direct examination of the colon should be repeated every  5-10 years until age 71. However, if early forms of precancerous polyps or small growths are found or if you have a family history or genetic risk for colorectal cancer, you may need to be screened more often.  Skin Cancer  Check your skin from head to toe regularly.  Monitor any moles. Be sure to tell your health care provider: ? About any new moles or changes in moles, especially if there is a change in a mole's shape or color. ? If you have a mole that is larger than the size of a pencil eraser.  If any of your family members has a history of skin cancer, especially at a young age, talk with your health care provider about genetic screening.  Always use sunscreen. Apply sunscreen liberally and repeatedly throughout the day.  Whenever you are outside, protect yourself by wearing long sleeves, pants, a wide-brimmed hat, and sunglasses.  What should I know about osteoporosis? Osteoporosis is a condition in which bone destruction happens more quickly than new bone creation. After menopause, you may be at an increased risk for osteoporosis. To help prevent osteoporosis or the bone fractures that can happen because of osteoporosis, the following is recommended:  If you are 46-71 years old, get at least 1,000 mg of calcium and at least 600 mg of vitamin D per day.  If you are older than age 55 but younger than age 65, get at least 1,200 mg of calcium and at least 600 mg of vitamin D per day.  If you are older than age 54, get at least 1,200 mg of calcium and at least 800 mg of vitamin D per day.  Smoking and excessive alcohol intake increase the risk of osteoporosis. Eat foods that are rich in calcium and vitamin D, and do weight-bearing exercises several times each week as directed by your health care provider. What should I know about how menopause affects my mental health? Depression may occur at any age, but it is more common as you become older. Common symptoms of depression  include:  Low or sad mood.  Changes in sleep patterns.  Changes in appetite or eating patterns.  Feeling an overall lack of motivation or enjoyment of activities that you previously enjoyed.  Frequent crying spells.  Talk with your health care provider if you think that you are experiencing depression. What should I know about immunizations? It is important that you get and maintain your immunizations. These include:  Tetanus, diphtheria, and pertussis (Tdap) booster vaccine.  Influenza every year before the flu season begins.  Pneumonia vaccine.  Shingles vaccine.  Your health care provider may also recommend other immunizations. This information is not intended to replace advice given to you by your health care provider. Make sure you discuss any questions you have with your health care provider. Document Released: 01/04/2006  Document Revised: 06/01/2016 Document Reviewed: 08/16/2015 Elsevier Interactive Patient Education  2018 [employer]  

## 2018-01-08 NOTE — Assessment & Plan Note (Signed)
LFTs 

## 2018-01-08 NOTE — Progress Notes (Signed)
Subjective:  Patient ID: Dana Long, female    DOB: Apr 02, 1951  Age: 66 y.o. MRN: [id number]  CC: No chief complaint on file.   HPI Dana Long presents for a [facility] well exam F/u HTN and allergies. F/u on thumb abscess  Outpatient Medications Prior to Visit  Medication Sig Dispense Refill  . Calcium-Phosphorus-Vitamin D (CITRACAL +D3 PO) Take 1 each by mouth daily. Reported on 02/21/2016    . clotrimazole-betamethasone (LOTRISONE) cream Apply 1 application topically 2 (two) times daily. 30 g 2  . fluticasone (FLONASE) 50 MCG/ACT nasal spray USE 2 SPRAYS INTO THE NOSE EVERY DAY 16 g 11  . ipratropium (ATROVENT) 0.06 % nasal spray USE 2 SPRAYS INTO EACH NOSTRIL 3 TIMES A DAY 15 mL 2  . promethazine-codeine (PHENERGAN WITH CODEINE) 6.25-10 MG/5ML syrup TAKE BY MOUTH EVERY 4 HOURS AS NEEDED 300 mL 1  . silver sulfADIAZINE (SILVADENE) 1 % cream Apply 1 application topically daily. 25 g 0  . triamterene-hydrochlorothiazide (MAXZIDE-25) 37.5-25 MG tablet TAKE 1 TABLET BY MOUTH EVERY DAY 90 tablet 3   No facility-administered medications prior to visit.     ROS Review of Systems  Constitutional: Negative for activity change, appetite change, chills, fatigue and unexpected weight change.  HENT: Negative for congestion, mouth sores and sinus pressure.   Eyes: Negative for visual disturbance.  Respiratory: Negative for cough and chest tightness.   Gastrointestinal: Negative for abdominal pain and nausea.  Genitourinary: Negative for difficulty urinating, frequency and vaginal pain.  Musculoskeletal: Negative for back pain and gait problem.  Skin: Negative for pallor and rash.  Neurological: Negative for dizziness, tremors, weakness, numbness and headaches.  Psychiatric/Behavioral: Negative for confusion and sleep disturbance.    Objective:  BP 128/84 (BP Location: Left Arm, Patient Position: Sitting, Cuff Size: Normal)   Pulse 79   Temp 98.2 F (36.8 C) (Oral)   Ht 5\' 1"   (1.549 m)   Wt 155 lb (70.3 kg)   SpO2 99%   BMI 29.29 kg/m   BP Readings from Last 3 Encounters:  01/08/18 128/84  12/24/17 136/84  12/22/17 (!) 156/80    Wt Readings from Last 3 Encounters:  01/08/18 155 lb (70.3 kg)  12/24/17 160 lb (72.6 kg)  04/08/17 142 lb 1.3 oz (64.4 kg)    Physical Exam  Constitutional: She appears well-developed. No distress.  HENT:  Head: Normocephalic.  Right Ear: External ear normal.  Left Ear: External ear normal.  Nose: Nose normal.  Mouth/Throat: Oropharynx is clear and moist.  Eyes: Conjunctivae are normal. Pupils are equal, round, and reactive to light. Right eye exhibits no discharge. Left eye exhibits no discharge.  Neck: Normal range of motion. Neck supple. No JVD present. No tracheal deviation present. No thyromegaly present.  Cardiovascular: Normal rate, regular rhythm and normal heart sounds.  Pulmonary/Chest: No stridor. No respiratory distress. She has no wheezes.  Abdominal: Soft. Bowel sounds are normal. She exhibits no distension and no mass. There is no tenderness. There is no rebound and no guarding.  Musculoskeletal: She exhibits no edema or tenderness.  Lymphadenopathy:    She has no cervical adenopathy.  Neurological: She displays normal reflexes. No cranial nerve deficit. She exhibits normal muscle tone. Coordination normal.  Skin: No rash noted. No erythema.  Psychiatric: She has a normal mood and affect. Her behavior is normal. Judgment and thought content normal.  thumb healed  Lab Results  Component Value Date   WBC 6.2 01/07/2017   HGB 13.8 01/07/2017  HCT 42.0 01/07/2017   PLT 294.0 01/07/2017   GLUCOSE 91 04/08/2017   CHOL 216 (H) 01/07/2017   TRIG 109.0 01/07/2017   HDL 53.50 01/07/2017   LDLCALC 141 (H) 01/07/2017   ALT 21 04/08/2017   AST 24 04/08/2017   NA 137 04/08/2017   K 3.6 04/08/2017   CL 99 04/08/2017   CREATININE 0.79 04/08/2017   BUN 8 04/08/2017   CO2 32 04/08/2017   TSH 0.25 (L)  04/08/2017   INR 1.12 03/21/2015    Dg Finger Thumb Right  Result Date: 12/22/2017 CLINICAL DATA:  Right thumb pain with swelling and bruising. EXAM: RIGHT THUMB 2+V COMPARISON:  None. FINDINGS: There is no evidence of fracture or dislocation. There is no evidence of arthropathy or other focal bone abnormality. Soft tissues are unremarkable IMPRESSION: Negative. Electronically Signed   By: Kennith CenterEric  Mansell M.D.   On: 12/22/2017 13:10    Assessment & Plan:   There are no diagnoses linked to this encounter. I am having Dana Long maintain her Calcium-Phosphorus-Vitamin D (CITRACAL +D3 PO), triamterene-hydrochlorothiazide, clotrimazole-betamethasone, ipratropium, promethazine-codeine, fluticasone, and silver sulfADIAZINE.  No orders of the defined types were placed in this encounter.    Follow-up: No Follow-up on file.  Sonda PrimesAlex Plotnikov, MD

## 2018-01-08 NOTE — Assessment & Plan Note (Signed)
[facility] 

## 2018-01-08 NOTE — Assessment & Plan Note (Addendum)
Here for medicare wellness/physical  Diet: heart healthy  Physical activity: not sedentary  Depression/mood screen: negative  Hearing: intact to whispered voice  Visual acuity: grossly normal w/glasses, performs annual eye exam  ADLs: capable  Fall risk: low to none  Home safety: good  Cognitive evaluation: intact to orientation, naming, recall and repetition  EOL planning: adv directives, full code/ I agree  I have personally reviewed and have noted  1. The patient's medical, surgical and social history  2. Their use of alcohol, tobacco or illicit drugs  3. Their current medications and supplements  4. The patient's functional ability including ADL's, fall risks, home safety risks and hearing or visual impairment.  5. Diet and physical activities  6. Evidence for depression or mood disorders 7. The roster of all physicians providing medical care to patient - is listed in the Snapshot section of the chart and reviewed today.    Today patient counseled on age appropriate routine health concerns for screening and prevention, each reviewed and up to date or declined. Immunizations reviewed and up to date or declined. Labs ordered and reviewed. Risk factors for depression reviewed and negative. Hearing function and visual acuity are intact. ADLs screened and addressed as needed. Functional ability and level of safety reviewed and appropriate. Education, counseling and referrals performed based on assessed risks today. Patient provided with a copy of personalized plan for preventive services.  Due PAP, mammo Ophth exam just had it Colon due in 2027 Dr Russella DarStark

## 2018-01-08 NOTE — Assessment & Plan Note (Signed)
TSH, FT4 

## 2018-03-11 ENCOUNTER — Other Ambulatory Visit: Payer: Self-pay | Admitting: Internal Medicine

## 2018-03-17 DIAGNOSIS — Z01419 Encounter for gynecological examination (general) (routine) without abnormal findings: Secondary | ICD-10-CM | POA: Diagnosis not present

## 2018-03-17 DIAGNOSIS — Z6827 Body mass index (BMI) 27.0-27.9, adult: Secondary | ICD-10-CM | POA: Diagnosis not present

## 2018-03-17 DIAGNOSIS — Z1231 Encounter for screening mammogram for malignant neoplasm of breast: Secondary | ICD-10-CM | POA: Diagnosis not present

## 2018-03-17 DIAGNOSIS — N958 Other specified menopausal and perimenopausal disorders: Secondary | ICD-10-CM | POA: Diagnosis not present

## 2018-06-17 ENCOUNTER — Ambulatory Visit (INDEPENDENT_AMBULATORY_CARE_PROVIDER_SITE_OTHER): Payer: Medicare Other | Admitting: Family

## 2018-06-17 ENCOUNTER — Encounter: Payer: Self-pay | Admitting: Family

## 2018-06-17 VITALS — BP 118/82 | HR 94 | Temp 98.0°F | Ht 61.0 in | Wt 150.4 lb

## 2018-06-17 DIAGNOSIS — J209 Acute bronchitis, unspecified: Secondary | ICD-10-CM

## 2018-06-17 MED ORDER — AZITHROMYCIN 250 MG PO TABS
ORAL_TABLET | ORAL | 0 refills | Status: DC
Start: 2018-06-17 — End: 2018-07-11

## 2018-06-17 MED ORDER — PROMETHAZINE-CODEINE 6.25-10 MG/5ML PO SYRP: ORAL_SOLUTION | ORAL | 0 refills | Status: DC

## 2018-06-17 NOTE — Progress Notes (Signed)
Dana Long is a 67 y.o. female with the following history as recorded in [employer]:  Patient Active Problem List   Diagnosis Date Noted  . Abscess of right thumb 12/29/2017  . Abnormal TSH 04/08/2017  . Liver fibrosis 03/20/2016  . Hep C w/o coma, chronic (HCC) 02/19/2015  . Frequent urination 02/16/2015  . Elevated LFTs 11/17/2014  . LBP (low back pain) 11/11/2013  . Well adult exam 11/14/2012  . 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 Allergic rhinitis, seasonal 04/25/2011  . Headache(784.0) 04/02/2011  . Sinusitis, acute 04/02/2011  . CERUMEN IMPACTION 01/23/2011  . Essential hypertension 01/23/2011  . GERD 01/23/2011  . ANKLE PAIN 01/23/2011  . PARESTHESIA 01/23/2011    Current Outpatient Medications  Medication Sig Dispense Refill  . Calcium-Phosphorus-Vitamin D (CITRACAL +D3 PO) Take 1 each by mouth daily. Reported on 02/21/2016    . fluticasone (FLONASE) 50 MCG/ACT nasal spray USE 2 SPRAYS INTO THE NOSE EVERY DAY 48 g 11  . ipratropium (ATROVENT) 0.06 % nasal spray USE 2 SPRAYS IN EACH NOSTRIL 3 TIMES A DAY 15 mL 2  . triamterene-hydrochlorothiazide (MAXZIDE-25) 37.5-25 MG tablet Take 1 tablet by mouth daily. 90 tablet 3  . azithromycin (ZITHROMAX) 250 MG tablet 2 tabs po qd x 1 day; 1 tablet per day x 4 days; 6 tablet 0  . promethazine-codeine (PHENERGAN WITH CODEINE) 6.25-10 MG/5ML syrup TAKE BY MOUTH EVERY 4 HOURS AS NEEDED 100 mL 0   No current facility-administered medications for this visit.     Allergies: Patient has no known allergies.  Past Medical History:  Diagnosis Date  . GERD (gastroesophageal reflux disease)   . Gout    ?  Marland Kitchen Hypertension     Past Surgical History:  Procedure Laterality Date  . TUBAL LIGATION      Family History  Problem Relation Age of Onset  . Gout Father   . Diabetes Mother   . Diabetes Sister   . Diabetes Brother   . Diabetes Other   . Hypertension Other   . Colon cancer Neg Hx     Social History   Tobacco Use  . Smoking  status: Former Games developer  . Smokeless tobacco: Never Used  Substance Use Topics  . Alcohol use: No    Alcohol/week: 0.0 oz    Subjective:  Patient presents with cough/ allergies in the past week; complaining of "nagging cough"- worried that she has underlying infection; "just can't shake the cough." Mild pain with coughing; + frontal headache; has been using her Flonase and Atrovent;    Objective:  Vitals:   06/17/18 1055  BP: 118/82  Pulse: 94  Temp: 98 F (36.7 C)  TempSrc: Oral  SpO2: 97%  Weight: 150 lb 6.4 oz (68.2 kg)  Height: 5\' 1"  (1.549 m)    General: Well developed, well nourished, in no acute distress  Skin : Warm and dry.  Head: Normocephalic and atraumatic  Eyes: Sclera and conjunctiva clear; pupils round and reactive to light; extraocular movements intact  Ears: External normal; canals clear; tympanic membranes normal  Oropharynx: Pink, supple. No suspicious lesions  Neck: Supple without thyromegaly, adenopathy  Lungs: Respirations unlabored; clear to auscultation bilaterally without wheeze, rales, rhonchi  CVS exam: normal rate and regular rhythm.  Neurologic: Alert and oriented; speech intact; face symmetrical; moves all extremities well; CNII-XII intact without focal deficit   Assessment:  1. Acute bronchitis, unspecified organism     Plan:  Suspect allergic component; continue Flonase; Rx for Z-pak, Phenergan with  Codeine ( per patient request, she gets this yearly); increase fluids, rest and follow-up worse, no better.   No follow-ups on file.  No orders of the defined types were placed in this encounter.   Requested Prescriptions   Signed Prescriptions Disp Refills  . azithromycin (ZITHROMAX) 250 MG tablet 6 tablet 0    Sig: 2 tabs po qd x 1 day; 1 tablet per day x 4 days;  . promethazine-codeine (PHENERGAN WITH CODEINE) 6.25-10 MG/5ML syrup 100 mL 0    Sig: TAKE 5MLS BY MOUTH EVERY 4 HOURS AS NEEDED

## 2018-07-11 ENCOUNTER — Ambulatory Visit (INDEPENDENT_AMBULATORY_CARE_PROVIDER_SITE_OTHER): Payer: Medicare Other | Admitting: Internal Medicine

## 2018-07-11 ENCOUNTER — Encounter: Payer: Self-pay | Admitting: Internal Medicine

## 2018-07-11 VITALS — BP 120/76 | HR 81 | Temp 98.4°F | Ht 61.0 in | Wt 149.0 lb

## 2018-07-11 DIAGNOSIS — K7400 Hepatic fibrosis, unspecified: Secondary | ICD-10-CM

## 2018-07-11 DIAGNOSIS — I10 Essential (primary) hypertension: Secondary | ICD-10-CM

## 2018-07-11 DIAGNOSIS — K219 Gastro-esophageal reflux disease without esophagitis: Secondary | ICD-10-CM

## 2018-07-11 DIAGNOSIS — J301 Allergic rhinitis due to pollen: Secondary | ICD-10-CM

## 2018-07-11 DIAGNOSIS — Z23 Encounter for immunization: Secondary | ICD-10-CM

## 2018-07-11 DIAGNOSIS — R059 Cough, unspecified: Secondary | ICD-10-CM | POA: Insufficient documentation

## 2018-07-11 DIAGNOSIS — R05 Cough: Secondary | ICD-10-CM | POA: Diagnosis not present

## 2018-07-11 MED ORDER — PROMETHAZINE-CODEINE 6.25-10 MG/5ML PO SYRP: ORAL_SOLUTION | ORAL | 0 refills | Status: DC

## 2018-07-11 MED ORDER — FLUTICASONE PROPIONATE 50 MCG/ACT NA SUSP: NASAL | 11 refills | Status: DC

## 2018-07-11 NOTE — Assessment & Plan Note (Signed)
Triamt/HCTZ

## 2018-07-11 NOTE — Assessment & Plan Note (Signed)
Prn PPI 

## 2018-07-11 NOTE — Progress Notes (Signed)
Subjective:  Patient ID: Dana Long, female    DOB: June 26, 1951  Age: 67 y.o. MRN: [id number]  CC: No chief complaint on file.   HPI Dana Long presents for HTN f/u.She had a recent URI - much better, coughing yet. F/u allergies  Outpatient Medications Prior to Visit  Medication Sig Dispense Refill  . Calcium-Phosphorus-Vitamin D (CITRACAL +D3 PO) Take 1 each by mouth daily. Reported on 02/21/2016    . fluticasone (FLONASE) 50 MCG/ACT nasal spray USE 2 SPRAYS INTO THE NOSE EVERY DAY 48 g 11  . ipratropium (ATROVENT) 0.06 % nasal spray USE 2 SPRAYS IN EACH NOSTRIL 3 TIMES A DAY 15 mL 2  . triamterene-hydrochlorothiazide (MAXZIDE-25) 37.5-25 MG tablet Take 1 tablet by mouth daily. 90 tablet 3  . azithromycin (ZITHROMAX) 250 MG tablet 2 tabs po qd x 1 day; 1 tablet per day x 4 days; 6 tablet 0  . promethazine-codeine (PHENERGAN WITH CODEINE) 6.25-10 MG/5ML syrup TAKE 5MLS BY MOUTH EVERY 4 HOURS AS NEEDED (Patient not taking: Reported on 07/11/2018) 100 mL 0   No facility-administered medications prior to visit.     ROS: Review of Systems  Constitutional: Negative for activity change, appetite change, chills, fatigue and unexpected weight change.  HENT: Negative for congestion, mouth sores and sinus pressure.   Eyes: Negative for visual disturbance.  Respiratory: Negative for cough and chest tightness.   Gastrointestinal: Negative for abdominal pain and nausea.  Genitourinary: Negative for difficulty urinating, frequency and vaginal pain.  Musculoskeletal: Positive for gait problem. Negative for back pain.  Skin: Negative for pallor and rash.  Neurological: Negative for dizziness, tremors, weakness, numbness and headaches.  Psychiatric/Behavioral: Negative for confusion, sleep disturbance and suicidal ideas.    Objective:  BP 120/76 (BP Location: Left Arm, Patient Position: Sitting, Cuff Size: Normal)   Pulse 81   Temp 98.4 F (36.9 C) (Oral)   Ht 5\' 1"  (1.549 m)   Wt 149  lb (67.6 kg)   SpO2 95%   BMI 28.15 kg/m   BP Readings from Last 3 Encounters:  07/11/18 120/76  06/17/18 118/82  01/08/18 128/84    Wt Readings from Last 3 Encounters:  07/11/18 149 lb (67.6 kg)  06/17/18 150 lb 6.4 oz (68.2 kg)  01/08/18 155 lb (70.3 kg)    Physical Exam  Constitutional: She appears well-developed. No distress.  HENT:  Head: Normocephalic.  Right Ear: External ear normal.  Left Ear: External ear normal.  Nose: Nose normal.  Mouth/Throat: Oropharynx is clear and moist.  Eyes: Pupils are equal, round, and reactive to light. Conjunctivae are normal. Right eye exhibits no discharge. Left eye exhibits no discharge.  Neck: Normal range of motion. Neck supple. No JVD present. No tracheal deviation present. No thyromegaly present.  Cardiovascular: Normal rate, regular rhythm and normal heart sounds.  Pulmonary/Chest: No stridor. No respiratory distress. She has no wheezes.  Abdominal: Soft. Bowel sounds are normal. She exhibits no distension and no mass. There is no tenderness. There is no rebound and no guarding.  Musculoskeletal: She exhibits no edema or tenderness.  Lymphadenopathy:    She has no cervical adenopathy.  Neurological: She displays normal reflexes. No cranial nerve deficit. She exhibits normal muscle tone. Coordination normal.  Skin: No rash noted. No erythema.  Psychiatric: She has a normal mood and affect. Her behavior is normal. Judgment and thought content normal.    Lab Results  Component Value Date   WBC 4.7 01/08/2018   HGB 14.8 01/08/2018  HCT 44.5 01/08/2018   PLT 288.0 01/08/2018   GLUCOSE 96 01/08/2018   CHOL 228 (H) 01/08/2018   TRIG 122.0 01/08/2018   HDL 43.80 01/08/2018   LDLCALC 160 (H) 01/08/2018   ALT 18 01/08/2018   AST 22 01/08/2018   NA 136 01/08/2018   K 3.7 01/08/2018   CL 97 01/08/2018   CREATININE 0.80 01/08/2018   BUN 11 01/08/2018   CO2 29 01/08/2018   TSH 0.41 01/08/2018   INR 1.12 03/21/2015    Dg  Finger Thumb Right  Result Date: 12/22/2017 CLINICAL DATA:  Right thumb pain with swelling and bruising. EXAM: RIGHT THUMB 2+V COMPARISON:  None. FINDINGS: There is no evidence of fracture or dislocation. There is no evidence of arthropathy or other focal bone abnormality. Soft tissues are unremarkable IMPRESSION: Negative. Electronically Signed   By: Kennith CenterEric  Mansell M.D.   On: 12/22/2017 13:10    Assessment & Plan:   There are no diagnoses linked to this encounter.   No orders of the defined types were placed in this encounter.    Follow-up: No follow-ups on file.  Sonda PrimesAlex Rikki Smestad, MD

## 2018-07-11 NOTE — Patient Instructions (Signed)
MC well w/Jill 

## 2018-07-11 NOTE — Assessment & Plan Note (Signed)
Flonase

## 2018-07-11 NOTE — Addendum Note (Signed)
Addended by: Scarlett PrestoFRIEDENBACH, Zameria Vogl on: 07/11/2018 10:49 AM   Modules accepted: Orders

## 2018-07-11 NOTE — Assessment & Plan Note (Signed)
post-URI - resolving Prom-cod syr

## 2018-07-11 NOTE — Assessment & Plan Note (Signed)
Monitoring LFT ?

## 2018-07-17 DIAGNOSIS — Z23 Encounter for immunization: Secondary | ICD-10-CM | POA: Diagnosis not present

## 2018-07-30 ENCOUNTER — Ambulatory Visit (INDEPENDENT_AMBULATORY_CARE_PROVIDER_SITE_OTHER): Payer: Medicare Other | Admitting: Internal Medicine

## 2018-07-30 ENCOUNTER — Encounter: Payer: Self-pay | Admitting: Internal Medicine

## 2018-07-30 DIAGNOSIS — I10 Essential (primary) hypertension: Secondary | ICD-10-CM | POA: Diagnosis not present

## 2018-07-30 DIAGNOSIS — Q178 Other specified congenital malformations of ear: Secondary | ICD-10-CM | POA: Diagnosis not present

## 2018-07-30 DIAGNOSIS — R05 Cough: Secondary | ICD-10-CM

## 2018-07-30 DIAGNOSIS — R059 Cough, unspecified: Secondary | ICD-10-CM

## 2018-07-30 NOTE — Assessment & Plan Note (Signed)
Resolved D/c Prom-cod syr

## 2018-07-30 NOTE — Assessment & Plan Note (Signed)
L earlobe split apart this past weekend ENT ref

## 2018-07-30 NOTE — Assessment & Plan Note (Signed)
BP is better Triamt/HCT

## 2018-07-30 NOTE — Progress Notes (Signed)
Subjective:  Patient ID: Dana Long, female    DOB: 08/06/1951  Age: 67 y.o. MRN: [id number]  CC: No chief complaint on file.   HPI Dana Long presents for cough, HTN f/u. BP OK at home C/o L earlobe split apart this past weekend  Outpatient Medications Prior to Visit  Medication Sig Dispense Refill  . Calcium-Phosphorus-Vitamin D (CITRACAL +D3 PO) Take 1 each by mouth daily. Reported on 02/21/2016    . fluticasone (FLONASE) 50 MCG/ACT nasal spray USE 2 SPRAYS INTO THE NOSE EVERY DAY 48 g 11  . ipratropium (ATROVENT) 0.06 % nasal spray USE 2 SPRAYS IN EACH NOSTRIL 3 TIMES A DAY 15 mL 2  . promethazine-codeine (PHENERGAN WITH CODEINE) 6.25-10 MG/5ML syrup TAKE BY MOUTH EVERY 4 HOURS AS NEEDED 240 mL 0  . triamterene-hydrochlorothiazide (MAXZIDE-25) 37.5-25 MG tablet Take 1 tablet by mouth daily. 90 tablet 3   No facility-administered medications prior to visit.     ROS: Review of Systems  Constitutional: Negative for activity change, appetite change, chills, fatigue and unexpected weight change.  HENT: Negative for congestion, mouth sores and sinus pressure.   Eyes: Negative for visual disturbance.  Respiratory: Negative for cough and chest tightness.   Gastrointestinal: Negative for abdominal pain and nausea.  Genitourinary: Negative for difficulty urinating, frequency and vaginal pain.  Musculoskeletal: Negative for back pain and gait problem.  Skin: Negative for pallor and rash.  Neurological: Negative for dizziness, tremors, weakness, numbness and headaches.  Psychiatric/Behavioral: Negative for confusion and sleep disturbance.    Objective:  BP (!) 150/90 (BP Location: Left Arm, Patient Position: Sitting, Cuff Size: Normal)   Pulse 100   Ht 5\' 1"  (1.549 m)   Wt 150 lb (68 kg)   SpO2 96%   BMI 28.34 kg/m   BP Readings from Last 3 Encounters:  07/30/18 (!) 150/90  07/11/18 120/76  06/17/18 118/82    Wt Readings from Last 3 Encounters:  07/30/18 150  lb (68 kg)  07/11/18 149 lb (67.6 kg)  06/17/18 150 lb 6.4 oz (68.2 kg)    Physical Exam  Constitutional: She appears well-developed. No distress.  HENT:  Head: Normocephalic.  Right Ear: External ear normal.  Left Ear: External ear normal.  Nose: Nose normal.  Mouth/Throat: Oropharynx is clear and moist.  Eyes: Pupils are equal, round, and reactive to light. Conjunctivae are normal. Right eye exhibits no discharge. Left eye exhibits no discharge.  Neck: Normal range of motion. Neck supple. No JVD present. No tracheal deviation present. No thyromegaly present.  Cardiovascular: Normal rate, regular rhythm and normal heart sounds.  Pulmonary/Chest: No stridor. No respiratory distress. She has no wheezes.  Abdominal: Soft. Bowel sounds are normal. She exhibits no distension and no mass. There is no tenderness. There is no rebound and no guarding.  Musculoskeletal: She exhibits no edema or tenderness.  Lymphadenopathy:    She has no cervical adenopathy.  Neurological: She displays normal reflexes. No cranial nerve deficit. She exhibits normal muscle tone. Coordination normal.  Skin: No rash noted. No erythema.  Psychiatric: She has a normal mood and affect. Her behavior is normal. Judgment and thought content normal.  L earlobe is split  Lab Results  Component Value Date   WBC 4.7 01/08/2018   HGB 14.8 01/08/2018   HCT 44.5 01/08/2018   PLT 288.0 01/08/2018   GLUCOSE 96 01/08/2018   CHOL 228 (H) 01/08/2018   TRIG 122.0 01/08/2018   HDL 43.80 01/08/2018   LDLCALC 160 (H)  01/08/2018   ALT 18 01/08/2018   AST 22 01/08/2018   NA 136 01/08/2018   K 3.7 01/08/2018   CL 97 01/08/2018   CREATININE 0.80 01/08/2018   BUN 11 01/08/2018   CO2 29 01/08/2018   TSH 0.41 01/08/2018   INR 1.12 03/21/2015    Dg Finger Thumb Right  Result Date: 12/22/2017 CLINICAL DATA:  Right thumb pain with swelling and bruising. EXAM: RIGHT THUMB 2+V COMPARISON:  None. FINDINGS: There is no evidence of  fracture or dislocation. There is no evidence of arthropathy or other focal bone abnormality. Soft tissues are unremarkable IMPRESSION: Negative. Electronically Signed   By: Kennith Center M.D.   On: 12/22/2017 13:10    Assessment & Plan:   There are no diagnoses linked to this encounter.   No orders of the defined types were placed in this encounter.    Follow-up: No follow-ups on file.  Sonda Primes, MD

## 2018-08-01 ENCOUNTER — Ambulatory Visit: Payer: Medicare Other | Admitting: Internal Medicine

## 2018-08-25 DIAGNOSIS — S01312D Laceration without foreign body of left ear, subsequent encounter: Secondary | ICD-10-CM | POA: Diagnosis not present

## 2018-08-31 ENCOUNTER — Encounter (HOSPITAL_COMMUNITY): Payer: Self-pay | Admitting: Emergency Medicine

## 2018-08-31 ENCOUNTER — Other Ambulatory Visit: Payer: Self-pay

## 2018-08-31 ENCOUNTER — Emergency Department (HOSPITAL_COMMUNITY)
Admission: EM | Admit: 2018-08-31 | Discharge: 2018-08-31 | Disposition: A | Discharge status: Home / Self Care | Payer: Medicare Other | Attending: Emergency Medicine | Admitting: Emergency Medicine

## 2018-08-31 ENCOUNTER — Emergency Department (HOSPITAL_COMMUNITY): Payer: Medicare Other

## 2018-08-31 DIAGNOSIS — M545 Low back pain: Secondary | ICD-10-CM | POA: Diagnosis not present

## 2018-08-31 DIAGNOSIS — Z79899 Other long term (current) drug therapy: Secondary | ICD-10-CM | POA: Diagnosis not present

## 2018-08-31 DIAGNOSIS — I10 Essential (primary) hypertension: Secondary | ICD-10-CM | POA: Insufficient documentation

## 2018-08-31 DIAGNOSIS — M25552 Pain in left hip: Secondary | ICD-10-CM | POA: Insufficient documentation

## 2018-08-31 DIAGNOSIS — Z87891 Personal history of nicotine dependence: Secondary | ICD-10-CM | POA: Insufficient documentation

## 2018-08-31 MED ORDER — DEXAMETHASONE SODIUM PHOSPHATE 10 MG/ML IJ SOLN
10.0000 mg | Freq: Once | INTRAMUSCULAR | Status: AC
  Administered 2018-08-31: 10 mg via INTRAMUSCULAR
  Filled 2018-08-31: qty 1

## 2018-08-31 NOTE — Discharge Instructions (Addendum)
Alternate tylenol and ibuprofen for pain. Use heat therapy. Avoid long periods of time sitting. Return to ER if any leg weakness, numbness, or bowel/bladder problems.

## 2018-08-31 NOTE — ED Provider Notes (Signed)
[facility]-EMERGENCY DEPT Provider Note   CSN: [id number] Arrival date & time: 08/31/18  1120     History   Chief Complaint Chief Complaint  Patient presents with  . Hip Pain    HPI Dana Long is a 67 y.o. female.  67 year old female with past medical history below who presents with left hip pain.  Over the past 5 days, she has had intermittent pain on the side of her left hip that sometimes starts in her buttock and goes down her leg.  She has not noticed anything that makes the pain better or worse.  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  No associated numbness or weakness.  No bowel or bladder problems.  She has been taking Advil at home and using heat therapy.  The history is provided by the patient.  Hip Pain     Past Medical History:  Diagnosis Date  . GERD (gastroesophageal reflux disease)   . Gout    ?  Marland Kitchen Hypertension     Patient Active Problem List   Diagnosis Date Noted  . Split ear lobe 07/30/2018  . Cough 07/11/2018  . Abscess of right thumb 12/29/2017  . Abnormal TSH 04/08/2017  . Liver fibrosis 03/20/2016  . Hep C w/o coma, chronic (HCC) 02/19/2015  . Frequent urination 02/16/2015  . Elevated LFTs 11/17/2014  . LBP (low back pain) 11/11/2013  . Well adult exam 11/14/2012  . 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 Allergic rhinitis, seasonal 04/25/2011  . Headache(784.0) 04/02/2011  . Sinusitis, acute 04/02/2011  . CERUMEN IMPACTION 01/23/2011  . Essential hypertension 01/23/2011  . GERD 01/23/2011  . ANKLE PAIN 01/23/2011  . PARESTHESIA 01/23/2011    Past Surgical History:  Procedure Laterality Date  . TUBAL LIGATION       OB History   None      Home Medications    Prior to Admission medications   Medication Sig Start Date End Date Taking? Authorizing Provider  Calcium-Phosphorus-Vitamin D (CITRACAL +D3 PO) Take 1 each by mouth daily. Reported on 02/21/2016     [provider]  fluticasone (FLONASE) 50 MCG/ACT nasal spray USE 2 SPRAYS INTO THE NOSE EVERY DAY 07/11/18   Plotnikov, Georgina Quint, MD  ipratropium (ATROVENT) 0.06 % nasal spray USE 2 SPRAYS IN EACH NOSTRIL 3 TIMES A DAY 03/11/18   Plotnikov, Georgina Quint, MD  promethazine-codeine (PHENERGAN WITH CODEINE) 6.25-10 MG/5ML syrup TAKE BY MOUTH EVERY 4 HOURS AS NEEDED 07/11/18   Plotnikov, Georgina Quint, MD  triamterene-hydrochlorothiazide (MAXZIDE-25) 37.5-25 MG tablet Take 1 tablet by mouth daily. 01/08/18   Plotnikov, Georgina Quint, MD    Family History Family History  Problem Relation Age of Onset  . Gout Father   . Diabetes Mother   . Diabetes Sister   . Diabetes Brother   . Diabetes Other   . Hypertension Other   . Colon cancer Neg Hx     Social History Social History   Tobacco Use  . Smoking status: Former Games developer  . Smokeless tobacco: Never Used  Substance Use Topics  . Alcohol use: No    Alcohol/week: 0.0 standard drinks  . Drug use: No     Allergies   Patient has no known allergies.   Review of Systems Review of Systems  Musculoskeletal: Positive for arthralgias and back pain. Negative for joint swelling.  Skin: Negative for rash.  Neurological: Negative for weakness and numbness.  All other systems  reviewed and are negative.    Physical Exam Updated Vital Signs BP 139/83 (BP Location: Left Arm)   Pulse 82   Temp 98 F (36.7 C) (Oral)   Resp 16   SpO2 96%   Physical Exam  Constitutional: She is oriented to person, place, and time. She appears well-developed and well-nourished. No distress.  HENT:  Head: Normocephalic and atraumatic.  Eyes: Conjunctivae are normal.  Neck: Neck supple.  Musculoskeletal: Normal range of motion. She exhibits no edema or tenderness.  Full ROM L hip and knee with no pain  Neurological: She is alert and oriented to person, place, and time. No sensory deficit.  5/5 strength BLE  Skin: Skin is warm and dry.  Psychiatric: She  has a normal mood and affect. Judgment normal.  Nursing note and vitals reviewed.    ED Treatments / Results  Labs (all labs ordered are listed, but only abnormal results are displayed) Labs Reviewed - No data to display  EKG None  Radiology Dg Lumbar Spine 2-3 Views  Result Date: 08/31/2018 CLINICAL DATA:  Low back pain, left hip pain EXAM: LUMBAR SPINE - 2-3 VIEW COMPARISON:  None. FINDINGS: 10 mm of anterolisthesis of L5 on S1 likely related to advanced degenerative facet disease. Degenerative disc disease at L5-S1 with disc space narrowing and vacuum disc. No fracture. SI joints symmetric and unremarkable. Calcified fibroid centrally in the pelvis. Bilateral tubal ligation clips present. IMPRESSION: Grade 2 anterolisthesis of L5 on S1 related to advanced facet disease. Degenerative disc disease at this level as well. Electronically Signed   By: Charlett Nose M.D.   On: 08/31/2018 13:00   Dg Hip Unilat With Pelvis 2-3 Views Left  Result Date: 08/31/2018 CLINICAL DATA:  Low back pain, left hip pain EXAM: DG HIP (WITH OR WITHOUT PELVIS) 2-3V LEFT COMPARISON:  None. FINDINGS: Hip joints and SI joints are symmetric and unremarkable. No acute bony abnormality. Specifically, no fracture, subluxation, or dislocation. Calcified fibroid centrally in the pelvis. Bilateral tubal ligation clips noted. IMPRESSION: No acute bony abnormality. Electronically Signed   By: Charlett Nose M.D.   On: 08/31/2018 12:59    Procedures Procedures (including critical care time)  Medications Ordered in ED Medications  dexamethasone (DECADRON) injection 10 mg (10 mg Intramuscular Given 08/31/18 1426)     Initial Impression / Assessment and Plan / ED Course  I have reviewed the triage vital signs and the nursing notes.  Pertinent imaging results that were available during my care of the patient were reviewed by me and considered in my medical decision making (see chart for details).     Well-appearing,  normal range of motion of hip, neurologically intact distally.  X-rays of hip and lumbar spine show grade 2 anterolisthesis of L5 on S1 as well as degenerative disc disease.  Given that her pain goes down her leg, she may have sciatica symptoms related to disc disease rather than actual problem with her hip.  I have discussed supportive measures including Tylenol/Motrin, heat therapy, range of motion exercises, and avoidance of prolonged sitting.  She requested a steroid shot.  I discussed risks and benefits and explained that it may or may not offer any benefit.  Gave dose of IM Decadron.  She will follow-up with her PCP and understands return precautions regarding any new neurologic symptoms.  Final Clinical Impressions(s) / ED Diagnoses   Final diagnoses:  Left hip pain    ED Discharge Orders    None  Temple Ewart, Ambrose Finland, MD 08/31/18 [phone]

## 2018-08-31 NOTE — ED Triage Notes (Signed)
Patient c/o left hip pain intermittently radiating down left leg x5 days. Denies trauma and injury. Ambulatory.

## 2018-09-05 ENCOUNTER — Encounter: Payer: Self-pay | Admitting: Family

## 2018-09-05 ENCOUNTER — Ambulatory Visit (INDEPENDENT_AMBULATORY_CARE_PROVIDER_SITE_OTHER): Payer: Medicare Other | Admitting: Family

## 2018-09-05 VITALS — BP 132/78 | HR 98 | Temp 97.6°F | Ht 61.0 in | Wt 154.0 lb

## 2018-09-05 DIAGNOSIS — M5416 Radiculopathy, lumbar region: Secondary | ICD-10-CM

## 2018-09-05 MED ORDER — METHYLPREDNISOLONE 4 MG PO TBPK
ORAL_TABLET | ORAL | 0 refills | Status: DC
Start: 2018-09-05 — End: 2019-04-01

## 2018-09-05 MED ORDER — METHOCARBAMOL 500 MG PO TABS: 500.0000 mg | ORAL_TABLET | Freq: Three times a day (TID) | ORAL | 0 refills | Status: DC | PRN

## 2018-09-05 NOTE — Progress Notes (Signed)
Dana Long is a 67 y.o. female with the following history as recorded in [employer]:  Patient Active Problem List   Diagnosis Date Noted  . Split ear lobe 07/30/2018  . Cough 07/11/2018  . Abscess of right thumb 12/29/2017  . Abnormal TSH 04/08/2017  . Liver fibrosis 03/20/2016  . Hep C w/o coma, chronic (HCC) 02/19/2015  . Frequent urination 02/16/2015  . Elevated LFTs 11/17/2014  . LBP (low back pain) 11/11/2013  . Well adult exam 11/14/2012  . 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 Allergic rhinitis, seasonal 04/25/2011  . Headache(784.0) 04/02/2011  . Sinusitis, acute 04/02/2011  . CERUMEN IMPACTION 01/23/2011  . Essential hypertension 01/23/2011  . GERD 01/23/2011  . ANKLE PAIN 01/23/2011  . PARESTHESIA 01/23/2011    Current Outpatient Medications  Medication Sig Dispense Refill  . Calcium-Phosphorus-Vitamin D (CITRACAL +D3 PO) Take 1 each by mouth daily. Reported on 02/21/2016    . fluticasone (FLONASE) 50 MCG/ACT nasal spray USE 2 SPRAYS INTO THE NOSE EVERY DAY 48 g 11  . ipratropium (ATROVENT) 0.06 % nasal spray USE 2 SPRAYS IN EACH NOSTRIL 3 TIMES A DAY 15 mL 2  . triamterene-hydrochlorothiazide (MAXZIDE-25) 37.5-25 MG tablet Take 1 tablet by mouth daily. 90 tablet 3  . methocarbamol (ROBAXIN) 500 MG tablet Take 1 tablet (500 mg total) by mouth every 8 (eight) hours as needed. 30 tablet 0  . methylPREDNISolone (MEDROL DOSEPAK) 4 MG TBPK tablet Taper as directed 21 tablet 0   No current facility-administered medications for this visit.     Allergies: Patient has no known allergies.  Past Medical History:  Diagnosis Date  . GERD (gastroesophageal reflux disease)   . Gout    ?  Marland Kitchen Hypertension     Past Surgical History:  Procedure Laterality Date  . TUBAL LIGATION      Family History  Problem Relation Age of Onset  . Gout Father   . Diabetes Mother   . Diabetes Sister   . Diabetes Brother   . Diabetes Other   . Hypertension Other   . Colon cancer Neg Hx     Social  History   Tobacco Use  . Smoking status: Former Games developer  . Smokeless tobacco: Never Used  Substance Use Topics  . 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     Objective:  Vitals:   09/05/18 1348  BP: 132/78  Pulse: 98  Temp: 97.6 F (36.4 C)  TempSrc: Oral  SpO2: 97%  Weight: 154 lb (69.9 kg)  Height: 5\' 1"  (1.549 m)    General: Well developed, well nourished, in no acute distress  Skin : Warm and dry.  Head: Normocephalic and atraumatic  Lungs: Respirations unlabored;  Musculoskeletal: No deformities; no active joint inflammation  Extremities: No edema, cyanosis, clubbing  Vessels: Symmetric bilaterally  Neurologic: Alert and oriented; speech intact; face symmetrical; moves all extremities well; CNII-XII intact without focal deficit   Assessment:  1. Lumbar back pain with radiculopathy affecting left lower extremity     Plan:  Reviewed X rays- arthritis changes noted in back; discussed MRI- she defers at this time;  Rx for Medrol Dose Pak and Robaxin; follow up worse, no better- to consider sports medicine.    No follow-ups on file.  No orders of the defined types were placed in this encounter.   Requested Prescriptions   Signed Prescriptions Disp Refills  . methylPREDNISolone (MEDROL DOSEPAK) 4 MG TBPK tablet 21 tablet 0    Sig: Taper as directed  . methocarbamol (ROBAXIN) 500 MG tablet 30 tablet 0    Sig: Take 1 tablet (500 mg total) by mouth every 8 (eight) hours as needed.

## 2018-09-09 DIAGNOSIS — S01312A Laceration without foreign body of left ear, initial encounter: Secondary | ICD-10-CM | POA: Diagnosis not present

## 2018-10-10 DIAGNOSIS — H2513 Age-related nuclear cataract, bilateral: Secondary | ICD-10-CM | POA: Diagnosis not present

## 2019-02-08 ENCOUNTER — Other Ambulatory Visit: Payer: Self-pay | Admitting: Internal Medicine

## 2019-02-08 DIAGNOSIS — K7400 Hepatic fibrosis, unspecified: Secondary | ICD-10-CM

## 2019-02-08 DIAGNOSIS — R7989 Other specified abnormal findings of blood chemistry: Secondary | ICD-10-CM

## 2019-02-08 DIAGNOSIS — I10 Essential (primary) hypertension: Secondary | ICD-10-CM

## 2019-02-14 IMAGING — DX DG FINGER THUMB 2+V*R*
3 series · 3 of 3 positions shown · non-contrast
Comparison: None.

CLINICAL DATA: Right thumb pain with swelling and bruising.

EXAM:
RIGHT THUMB 2+V

[finger ap]
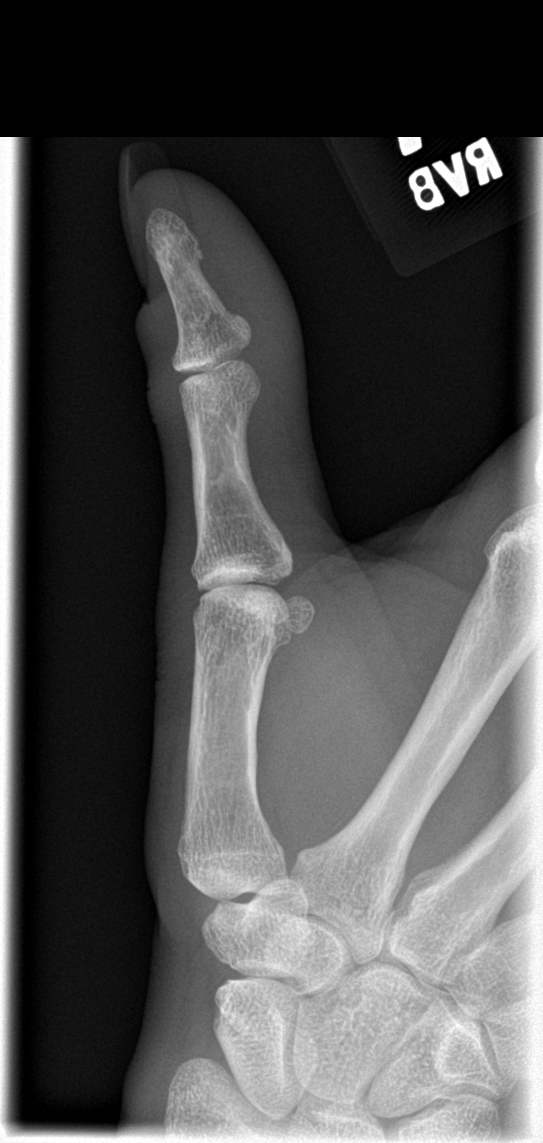

[finger obl]
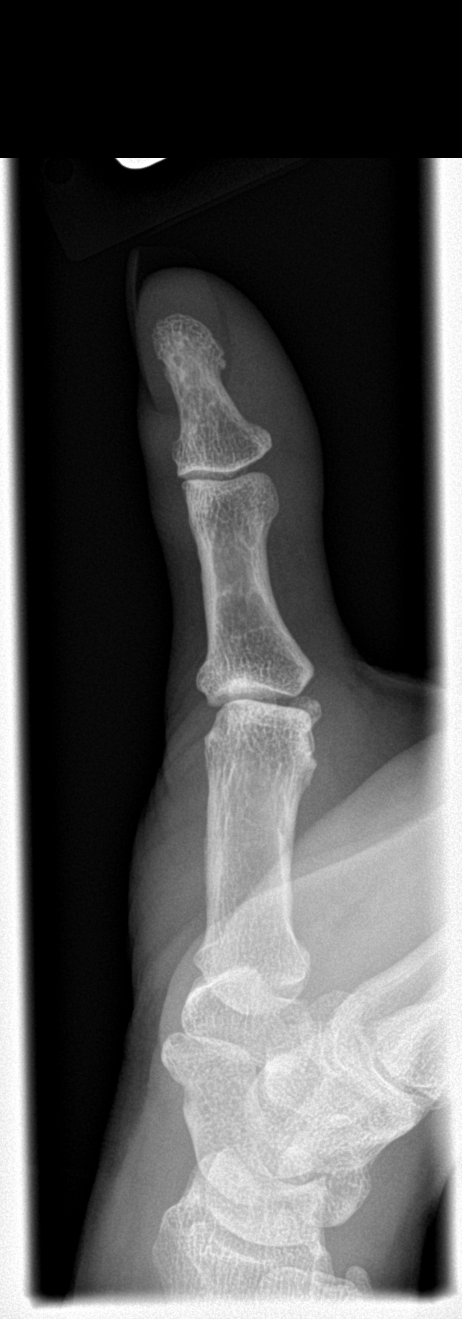

[finger lat]
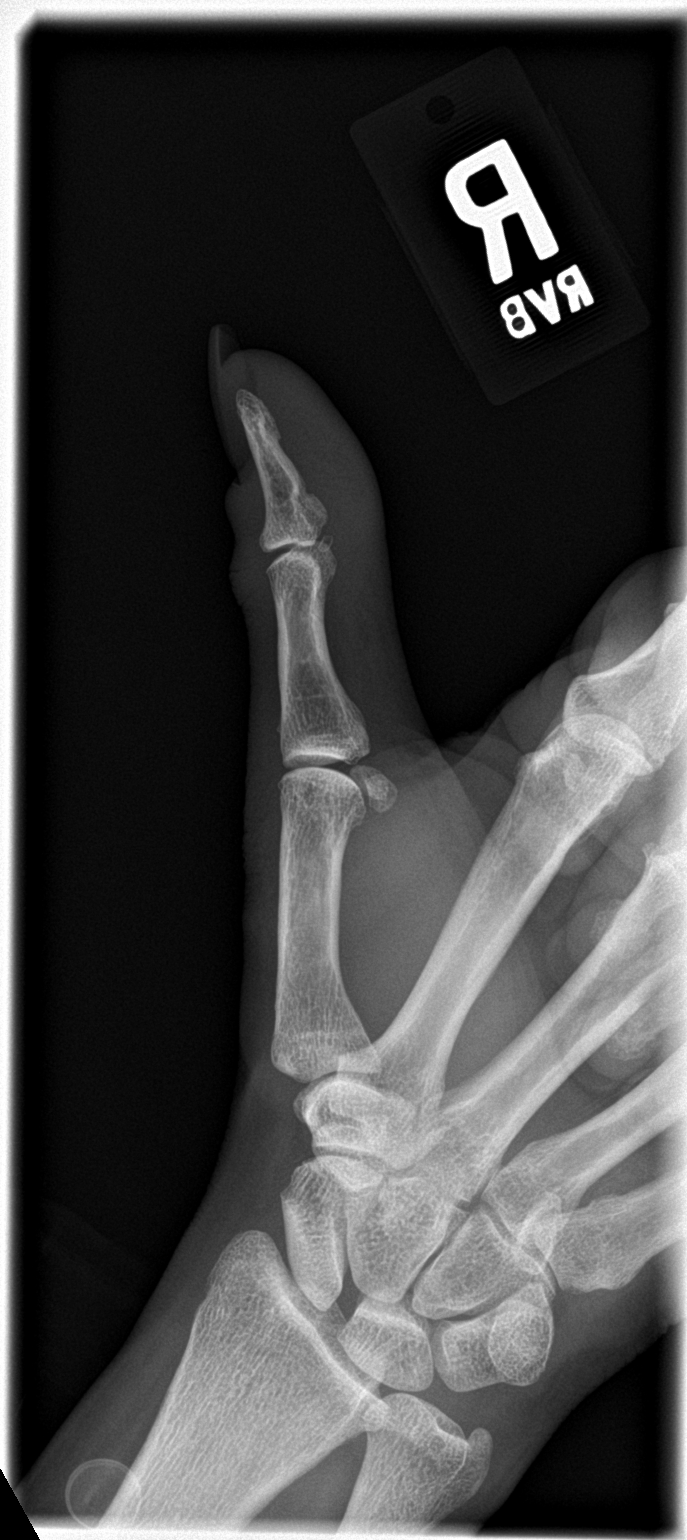

[3 of 3 positions shown; findings below may reference images not displayed]

FINDINGS: There is no evidence of fracture or dislocation. There is no
evidence of arthropathy or other focal bone abnormality. Soft
tissues are unremarkable
IMPRESSION: Negative.

## 2019-03-13 ENCOUNTER — Other Ambulatory Visit: Payer: Self-pay | Admitting: Internal Medicine

## 2019-03-27 DIAGNOSIS — Z6828 Body mass index (BMI) 28.0-28.9, adult: Secondary | ICD-10-CM | POA: Diagnosis not present

## 2019-03-27 DIAGNOSIS — Z124 Encounter for screening for malignant neoplasm of cervix: Secondary | ICD-10-CM | POA: Diagnosis not present

## 2019-03-27 DIAGNOSIS — Z1231 Encounter for screening mammogram for malignant neoplasm of breast: Secondary | ICD-10-CM | POA: Diagnosis not present

## 2019-03-27 DIAGNOSIS — Z01419 Encounter for gynecological examination (general) (routine) without abnormal findings: Secondary | ICD-10-CM | POA: Diagnosis not present

## 2019-03-27 LAB — HM MAMMOGRAPHY

## 2019-03-27 LAB — HM PAP SMEAR: HM Pap smear: NORMAL

## 2019-04-01 ENCOUNTER — Encounter: Payer: Self-pay | Admitting: Internal Medicine

## 2019-04-01 ENCOUNTER — Ambulatory Visit (INDEPENDENT_AMBULATORY_CARE_PROVIDER_SITE_OTHER): Payer: Medicare Other | Admitting: Internal Medicine

## 2019-04-01 DIAGNOSIS — L509 Urticaria, unspecified: Secondary | ICD-10-CM | POA: Diagnosis not present

## 2019-04-01 DIAGNOSIS — I10 Essential (primary) hypertension: Secondary | ICD-10-CM | POA: Diagnosis not present

## 2019-04-01 DIAGNOSIS — K219 Gastro-esophageal reflux disease without esophagitis: Secondary | ICD-10-CM

## 2019-04-01 MED ORDER — CETIRIZINE HCL 10 MG PO TABS: 10.0000 mg | ORAL_TABLET | Freq: Every day | ORAL | 11 refills | Status: DC

## 2019-04-01 MED ORDER — METHYLPREDNISOLONE 4 MG PO TBPK: ORAL_TABLET | ORAL | 0 refills | Status: DC

## 2019-04-01 MED ORDER — TRIAMCINOLONE ACETONIDE 0.1 % EX CREA: 1.0000 "application " | TOPICAL_CREAM | Freq: Four times a day (QID) | CUTANEOUS | 1 refills | Status: DC

## 2019-04-01 MED ORDER — DIPHENHYDRAMINE HCL 25 MG PO TABS: 25.0000 mg | ORAL_TABLET | Freq: Three times a day (TID) | ORAL | 0 refills | Status: DC | PRN

## 2019-04-01 NOTE — Progress Notes (Signed)
Virtual Visit via Video Note  I connected with Dana Long on 04/01/19 at 10:20 AM EDT by a video enabled telemedicine application and verified that I am speaking with the correct person using two identifiers.   I discussed the limitations of evaluation and management by telemedicine and the availability of in person appointments. The patient expressed understanding and agreed to proceed.  History of Present Illness: The patient is complaining of hives that appeared first last night.  They appear on the back of her neck, both arms and both legs.  They have been itching.  They would come and go.  She was able to sleep last night.  She takes no new meds, no new foods, no new detergents etc. no mouth lesions.  No fever or chills Follow-up hypertension, GERD  There has been no runny nose, cough, chest pain, shortness of breath, abdominal pain, diarrhea, constipation, arthralgias   Observations/Objective: The patient appears to be in no acute distress, looks well.  Our video encounter was brief.  Video has disappeared.  We had to continue our visit with for you only  Assessment and Plan: COVID-19 precautions discussed See my Assessment and Plan. Follow Up Instructions:    I discussed the assessment and treatment plan with the patient. The patient was provided an opportunity to ask questions and all were answered. The patient agreed with the plan and demonstrated an understanding of the instructions.   The patient was advised to call back or seek an in-person evaluation if the symptoms worsen or if the condition fails to improve as anticipated.  I provided face-to-face time during this encounter. We were at different locations.   Sonda Primes, MD

## 2019-04-01 NOTE — Assessment & Plan Note (Signed)
Unclear etiology.  Medrol Dosepak.  Zyrtec daily.  Benadryl as needed.  Triamcinolone 0.1% cream 3 times a day; let me know if not better

## 2019-04-01 NOTE — Assessment & Plan Note (Signed)
Continue with the Maxide.  May need to substitute if rash would not go away

## 2019-04-01 NOTE — Assessment & Plan Note (Signed)
PRN Meds.  No new meds

## 2019-05-27 ENCOUNTER — Other Ambulatory Visit: Payer: Self-pay

## 2019-05-27 ENCOUNTER — Ambulatory Visit (INDEPENDENT_AMBULATORY_CARE_PROVIDER_SITE_OTHER): Payer: Medicare Other | Admitting: Internal Medicine

## 2019-05-27 ENCOUNTER — Other Ambulatory Visit (INDEPENDENT_AMBULATORY_CARE_PROVIDER_SITE_OTHER): Payer: Medicare Other

## 2019-05-27 ENCOUNTER — Encounter: Payer: Self-pay | Admitting: Internal Medicine

## 2019-05-27 VITALS — BP 130/78 | HR 91 | Temp 98.0°F | Ht 61.0 in | Wt 148.0 lb

## 2019-05-27 DIAGNOSIS — R945 Abnormal results of liver function studies: Secondary | ICD-10-CM

## 2019-05-27 DIAGNOSIS — R7989 Other specified abnormal findings of blood chemistry: Secondary | ICD-10-CM | POA: Diagnosis not present

## 2019-05-27 DIAGNOSIS — I10 Essential (primary) hypertension: Secondary | ICD-10-CM | POA: Diagnosis not present

## 2019-05-27 DIAGNOSIS — L509 Urticaria, unspecified: Secondary | ICD-10-CM

## 2019-05-27 DIAGNOSIS — E785 Hyperlipidemia, unspecified: Secondary | ICD-10-CM | POA: Diagnosis not present

## 2019-05-27 LAB — HEPATIC FUNCTION PANEL
ALT: 19 U/L (ref 0–35)
AST: 27 U/L (ref 0–37)
Albumin: 5.1 g/dL (ref 3.5–5.2)
Alkaline Phosphatase: 48 U/L (ref 39–117)
Bilirubin, Direct: 0.1 mg/dL (ref 0.0–0.3)
Total Bilirubin: 0.6 mg/dL (ref 0.2–1.2)
Total Protein: 8.7 g/dL — ABNORMAL HIGH (ref 6.0–8.3)

## 2019-05-27 LAB — LIPID PANEL
Cholesterol: 190 mg/dL (ref 0–200)
HDL: 50.1 mg/dL (ref >39.00)
LDL Cholesterol: 126 mg/dL — ABNORMAL HIGH (ref 0–99)
NonHDL: 139.46
Total CHOL/HDL Ratio: 4
Triglycerides: 67 mg/dL (ref 0.0–149.0)
VLDL: 13.4 mg/dL (ref 0.0–40.0)

## 2019-05-27 LAB — BASIC METABOLIC PANEL
BUN: 9 mg/dL (ref 6–23)
CO2: 26 mEq/L (ref 19–32)
Calcium: 10.5 mg/dL (ref 8.4–10.5)
Chloride: 95 mEq/L — ABNORMAL LOW (ref 96–112)
Creatinine, Ser: 0.84 mg/dL (ref 0.40–1.20)
GFR: 81.84 mL/min (ref >60.00)
Glucose, Bld: 99 mg/dL (ref 70–99)
Potassium: 3.5 mEq/L (ref 3.5–5.1)
Sodium: 133 mEq/L — ABNORMAL LOW (ref 135–145)

## 2019-05-27 LAB — URINALYSIS
Bilirubin Urine: NEGATIVE
Hgb urine dipstick: NEGATIVE
Ketones, ur: NEGATIVE
Leukocytes,Ua: NEGATIVE
Nitrite: NEGATIVE
Specific Gravity, Urine: 1.01 (ref 1.000–1.030)
Total Protein, Urine: NEGATIVE
Urine Glucose: NEGATIVE
Urobilinogen, UA: 0.2 (ref 0.0–1.0)
pH: 8 (ref 5.0–8.0)

## 2019-05-27 LAB — CBC WITH DIFFERENTIAL/PLATELET
Basophils Absolute: 0 10*3/uL (ref 0.0–0.1)
Basophils Relative: 0.6 % (ref 0.0–3.0)
Eosinophils Absolute: 0.2 10*3/uL (ref 0.0–0.7)
Eosinophils Relative: 3.9 % (ref 0.0–5.0)
HCT: 45 % (ref 36.0–46.0)
Hemoglobin: 14.9 g/dL (ref 12.0–15.0)
Lymphocytes Relative: 47.4 % — ABNORMAL HIGH (ref 12.0–46.0)
Lymphs Abs: 2.3 10*3/uL (ref 0.7–4.0)
MCHC: 33.2 g/dL (ref 30.0–36.0)
MCV: 81.8 fl (ref 78.0–100.0)
Monocytes Absolute: 0.4 10*3/uL (ref 0.1–1.0)
Monocytes Relative: 7.2 % (ref 3.0–12.0)
Neutro Abs: 2 10*3/uL (ref 1.4–7.7)
Neutrophils Relative %: 40.9 % — ABNORMAL LOW (ref 43.0–77.0)
Platelets: 292 10*3/uL (ref 150.0–400.0)
RBC: 5.5 Mil/uL — ABNORMAL HIGH (ref 3.87–5.11)
RDW: 13 % (ref 11.5–15.5)
WBC: 4.9 10*3/uL (ref 4.0–10.5)

## 2019-05-27 LAB — TSH: TSH: 0.35 u[IU]/mL (ref 0.35–4.50)

## 2019-05-27 MED ORDER — TELMISARTAN 40 MG PO TABS: 40.0000 mg | ORAL_TABLET | Freq: Every day | ORAL | 3 refills | Status: DC

## 2019-05-27 NOTE — Assessment & Plan Note (Signed)
TSH,

## 2019-05-27 NOTE — Assessment & Plan Note (Signed)
hold Maxzide. Start Micardis 80 mg/d

## 2019-05-27 NOTE — Assessment & Plan Note (Signed)
6/20 hold Maxzide due to rash. Start Micardis 80 mg/d

## 2019-05-27 NOTE — Patient Instructions (Addendum)
If you have medicare related insurance (such as traditional Medicare, [employer], [employer], or similar), Please make an appointment at the scheduling desk with Sharee Pimple, the [employer], for your Wellness visit in this office, which is a benefit with your insurance.  Wt Readings from Last 3 Encounters:  05/27/19 148 lb (67.1 kg)  09/05/18 154 lb (69.9 kg)  07/30/18 150 lb (68 kg)   BP Readings from Last 3 Encounters:  05/27/19 130/78  09/05/18 132/78  08/31/18 139/83

## 2019-05-27 NOTE — Assessment & Plan Note (Signed)
LFTs 

## 2019-05-27 NOTE — Progress Notes (Signed)
Subjective:  Patient ID: Dana Long, female    DOB: Feb 21, 1951  Age: 68 y.o. MRN: [id number]  CC: No chief complaint on file.   HPI Dana Long presents for rash, itching f/u F/u HTN  Outpatient Medications Prior to Visit  Medication Sig Dispense Refill  . Calcium-Phosphorus-Vitamin D (CITRACAL +D3 PO) Take 1 each by mouth daily. Reported on 02/21/2016    . cetirizine (ZYRTEC) 10 MG tablet Take 1 tablet (10 mg total) by mouth daily. 30 tablet 11  . diphenhydrAMINE (BENADRYL ALLERGY) 25 MG tablet Take 1 tablet (25 mg total) by mouth every 8 (eight) hours as needed for allergies. 30 tablet 0  . fluticasone (FLONASE) 50 MCG/ACT nasal spray USE 2 SPRAYS INTO THE NOSE EVERY DAY 48 g 11  . ipratropium (ATROVENT) 0.06 % nasal spray USE 2 SPRAYS IN EACH NOSTRIL 3 TIMES A DAY 15 mL 2  . methocarbamol (ROBAXIN) 500 MG tablet Take 1 tablet (500 mg total) by mouth every 8 (eight) hours as needed. 30 tablet 0  . methylPREDNISolone (MEDROL DOSEPAK) 4 MG TBPK tablet Taper as directed 21 tablet 0  . triamcinolone cream (KENALOG) 0.1 % Apply 1 application topically 4 (four) times daily. 80 g 1  . triamterene-hydrochlorothiazide (MAXZIDE-25) 37.5-25 MG tablet TAKE 1 TABLET BY MOUTH EVERY DAY 90 tablet 3   No facility-administered medications prior to visit.     ROS: Review of Systems  Constitutional: Negative for activity change, appetite change, chills, fatigue and unexpected weight change.  HENT: Negative for congestion, mouth sores and sinus pressure.   Eyes: Negative for visual disturbance.  Respiratory: Negative for cough and chest tightness.   Gastrointestinal: Negative for abdominal pain and nausea.  Genitourinary: Negative for difficulty urinating, frequency and vaginal pain.  Musculoskeletal: Negative for back pain and gait problem.  Skin: Positive for rash. Negative for pallor.  Neurological: Negative for dizziness, tremors, weakness, numbness and headaches.   Psychiatric/Behavioral: Negative for confusion and sleep disturbance.    Objective:  BP 130/78 (BP Location: Right Arm, Patient Position: Sitting, Cuff Size: Normal)   Pulse 91   Temp 98 F (36.7 C) (Oral)   Ht 5\' 1"  (1.549 m)   Wt 148 lb (67.1 kg)   SpO2 97%   BMI 27.96 kg/m   BP Readings from Last 3 Encounters:  05/27/19 130/78  09/05/18 132/78  08/31/18 139/83    Wt Readings from Last 3 Encounters:  05/27/19 148 lb (67.1 kg)  09/05/18 154 lb (69.9 kg)  07/30/18 150 lb (68 kg)    Physical Exam Constitutional:      General: She is not in acute distress.    Appearance: She is well-developed.  HENT:     Head: Normocephalic.     Right Ear: External ear normal.     Left Ear: External ear normal.     Nose: Nose normal.  Eyes:     General:        Right eye: No discharge.        Left eye: No discharge.     Conjunctiva/sclera: Conjunctivae normal.     Pupils: Pupils are equal, round, and reactive to light.  Neck:     Musculoskeletal: Normal range of motion and neck supple.     Thyroid: No thyromegaly.     Vascular: No JVD.     Trachea: No tracheal deviation.  Cardiovascular:     Rate and Rhythm: Normal rate and regular rhythm.     Heart sounds: Normal heart sounds.  Pulmonary:     Effort: No respiratory distress.     Breath sounds: No stridor. No wheezing.  Abdominal:     General: Bowel sounds are normal. There is no distension.     Palpations: Abdomen is soft. There is no mass.     Tenderness: There is no abdominal tenderness. There is no guarding or rebound.  Musculoskeletal:        General: No tenderness.  Lymphadenopathy:     Cervical: No cervical adenopathy.  Skin:    Findings: No erythema or rash.  Neurological:     Cranial Nerves: No cranial nerve deficit.     Motor: No abnormal muscle tone.     Coordination: Coordination normal.     Deep Tendon Reflexes: Reflexes normal.  Psychiatric:        Behavior: Behavior normal.        Thought Content:  Thought content normal.        Judgment: Judgment normal.   hives  Lab Results  Component Value Date   WBC 4.7 01/08/2018   HGB 14.8 01/08/2018   HCT 44.5 01/08/2018   PLT 288.0 01/08/2018   GLUCOSE 96 01/08/2018   CHOL 228 (H) 01/08/2018   TRIG 122.0 01/08/2018   HDL 43.80 01/08/2018   LDLCALC 160 (H) 01/08/2018   ALT 18 01/08/2018   AST 22 01/08/2018   NA 136 01/08/2018   K 3.7 01/08/2018   CL 97 01/08/2018   CREATININE 0.80 01/08/2018   BUN 11 01/08/2018   CO2 29 01/08/2018   TSH 0.41 01/08/2018   INR 1.12 03/21/2015    Dg Lumbar Spine 2-3 Views  Result Date: 08/31/2018 CLINICAL DATA:  Low back pain, left hip pain EXAM: LUMBAR SPINE - 2-3 VIEW COMPARISON:  None. FINDINGS: 10 mm of anterolisthesis of L5 on S1 likely related to advanced degenerative facet disease. Degenerative disc disease at L5-S1 with disc space narrowing and vacuum disc. No fracture. SI joints symmetric and unremarkable. Calcified fibroid centrally in the pelvis. Bilateral tubal ligation clips present. IMPRESSION: Grade 2 anterolisthesis of L5 on S1 related to advanced facet disease. Degenerative disc disease at this level as well. Electronically Signed   By: Charlett NoseKevin  Dover M.D.   On: 08/31/2018 13:00   Dg Hip Unilat With Pelvis 2-3 Views Left  Result Date: 08/31/2018 CLINICAL DATA:  Low back pain, left hip pain EXAM: DG HIP (WITH OR WITHOUT PELVIS) 2-3V LEFT COMPARISON:  None. FINDINGS: Hip joints and SI joints are symmetric and unremarkable. No acute bony abnormality. Specifically, no fracture, subluxation, or dislocation. Calcified fibroid centrally in the pelvis. Bilateral tubal ligation clips noted. IMPRESSION: No acute bony abnormality. Electronically Signed   By: Charlett NoseKevin  Dover M.D.   On: 08/31/2018 12:59    Assessment & Plan:   There are no diagnoses linked to this encounter.   No orders of the defined types were placed in this encounter.    Follow-up: No follow-ups on file.  Sonda PrimesAlex Aby Gessel, MD

## 2019-06-03 ENCOUNTER — Encounter: Payer: Self-pay | Admitting: Internal Medicine

## 2019-06-22 ENCOUNTER — Ambulatory Visit (INDEPENDENT_AMBULATORY_CARE_PROVIDER_SITE_OTHER): Payer: Medicare Other | Admitting: Internal Medicine

## 2019-06-22 ENCOUNTER — Ambulatory Visit (INDEPENDENT_AMBULATORY_CARE_PROVIDER_SITE_OTHER)
Admission: RE | Admit: 2019-06-22 | Discharge: 2019-06-22 | Disposition: A | Discharge status: Home / Self Care | Payer: Medicare Other | Source: Ambulatory Visit | Attending: Internal Medicine | Admitting: Internal Medicine

## 2019-06-22 ENCOUNTER — Encounter: Payer: Self-pay | Admitting: Internal Medicine

## 2019-06-22 ENCOUNTER — Other Ambulatory Visit: Payer: Self-pay

## 2019-06-22 DIAGNOSIS — M542 Cervicalgia: Secondary | ICD-10-CM | POA: Diagnosis not present

## 2019-06-22 DIAGNOSIS — S134XXA Sprain of ligaments of cervical spine, initial encounter: Secondary | ICD-10-CM

## 2019-06-22 DIAGNOSIS — M25519 Pain in unspecified shoulder: Secondary | ICD-10-CM | POA: Insufficient documentation

## 2019-06-22 DIAGNOSIS — S199XXA Unspecified injury of neck, initial encounter: Secondary | ICD-10-CM | POA: Diagnosis not present

## 2019-06-22 DIAGNOSIS — M25512 Pain in left shoulder: Secondary | ICD-10-CM

## 2019-06-22 MED ORDER — CYCLOBENZAPRINE HCL 5 MG PO TABS: 5.0000 mg | ORAL_TABLET | Freq: Three times a day (TID) | ORAL | 0 refills | Status: DC | PRN

## 2019-06-22 MED ORDER — TRAMADOL HCL 50 MG PO TABS: 25.0000 mg | ORAL_TABLET | Freq: Four times a day (QID) | ORAL | 0 refills | Status: DC | PRN

## 2019-06-22 MED ORDER — TRAMADOL HCL 50 MG PO TABS: 25.0000 mg | ORAL_TABLET | Freq: Four times a day (QID) | ORAL | 0 refills | Status: AC | PRN

## 2019-06-22 NOTE — Assessment & Plan Note (Signed)
Tramadol and Flexeril prn PT
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
## 2019-06-22 NOTE — Assessment & Plan Note (Signed)
Ref to PT

## 2019-07-15 ENCOUNTER — Ambulatory Visit: Payer: Medicare Other | Attending: Internal Medicine | Admitting: Physical Therapy

## 2019-07-15 ENCOUNTER — Other Ambulatory Visit: Payer: Self-pay

## 2019-07-15 ENCOUNTER — Encounter: Payer: Self-pay | Admitting: Physical Therapy

## 2019-07-15 DIAGNOSIS — M25512 Pain in left shoulder: Secondary | ICD-10-CM

## 2019-07-15 DIAGNOSIS — M25511 Pain in right shoulder: Secondary | ICD-10-CM | POA: Diagnosis not present

## 2019-07-15 DIAGNOSIS — M542 Cervicalgia: Secondary | ICD-10-CM

## 2019-07-15 DIAGNOSIS — R293 Abnormal posture: Secondary | ICD-10-CM | POA: Insufficient documentation
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
## 2019-07-28 ENCOUNTER — Other Ambulatory Visit: Payer: Self-pay

## 2019-07-28 ENCOUNTER — Ambulatory Visit: Payer: Medicare Other | Attending: Internal Medicine | Admitting: Physical Therapy

## 2019-07-28 DIAGNOSIS — M25511 Pain in right shoulder: Secondary | ICD-10-CM | POA: Diagnosis not present

## 2019-07-28 DIAGNOSIS — R293 Abnormal posture: Secondary | ICD-10-CM | POA: Diagnosis not present

## 2019-07-28 DIAGNOSIS — M542 Cervicalgia: Secondary | ICD-10-CM | POA: Insufficient documentation

## 2019-07-28 DIAGNOSIS — M25512 Pain in left shoulder: Secondary | ICD-10-CM | POA: Insufficient documentation
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
## 2019-07-29 DIAGNOSIS — Z23 Encounter for immunization: Secondary | ICD-10-CM | POA: Diagnosis not present

## 2019-07-31 ENCOUNTER — Other Ambulatory Visit: Payer: Self-pay

## 2019-07-31 ENCOUNTER — Ambulatory Visit: Payer: Medicare Other | Admitting: Physical Therapy

## 2019-07-31 ENCOUNTER — Encounter: Payer: Self-pay | Admitting: Physical Therapy

## 2019-07-31 DIAGNOSIS — R293 Abnormal posture: Secondary | ICD-10-CM | POA: Diagnosis not present

## 2019-07-31 DIAGNOSIS — M542 Cervicalgia: Secondary | ICD-10-CM

## 2019-07-31 DIAGNOSIS — M25512 Pain in left shoulder: Secondary | ICD-10-CM | POA: Diagnosis not present

## 2019-07-31 DIAGNOSIS — M25511 Pain in right shoulder: Secondary | ICD-10-CM
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
## 2019-08-05 ENCOUNTER — Ambulatory Visit: Payer: Medicare Other | Admitting: Physical Therapy

## 2019-08-05 ENCOUNTER — Encounter: Payer: Self-pay | Admitting: Physical Therapy

## 2019-08-05 ENCOUNTER — Other Ambulatory Visit: Payer: Self-pay

## 2019-08-05 DIAGNOSIS — M25512 Pain in left shoulder: Secondary | ICD-10-CM | POA: Diagnosis not present

## 2019-08-05 DIAGNOSIS — M542 Cervicalgia: Secondary | ICD-10-CM | POA: Diagnosis not present

## 2019-08-05 DIAGNOSIS — M25511 Pain in right shoulder: Secondary | ICD-10-CM

## 2019-08-05 DIAGNOSIS — R293 Abnormal posture: Secondary | ICD-10-CM
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
## 2019-08-07 ENCOUNTER — Ambulatory Visit: Payer: Medicare Other | Admitting: Physical Therapy

## 2019-08-07 ENCOUNTER — Encounter: Payer: Self-pay | Admitting: Physical Therapy

## 2019-08-07 ENCOUNTER — Other Ambulatory Visit: Payer: Self-pay

## 2019-08-07 DIAGNOSIS — R293 Abnormal posture: Secondary | ICD-10-CM | POA: Diagnosis not present

## 2019-08-07 DIAGNOSIS — M542 Cervicalgia: Secondary | ICD-10-CM

## 2019-08-07 DIAGNOSIS — M25512 Pain in left shoulder: Secondary | ICD-10-CM | POA: Diagnosis not present

## 2019-08-07 DIAGNOSIS — M25511 Pain in right shoulder: Secondary | ICD-10-CM | POA: Diagnosis not present
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
## 2019-08-10 ENCOUNTER — Ambulatory Visit: Payer: Medicare Other | Admitting: Physical Therapy

## 2019-08-10 ENCOUNTER — Other Ambulatory Visit: Payer: Self-pay

## 2019-08-10 DIAGNOSIS — M25512 Pain in left shoulder: Secondary | ICD-10-CM

## 2019-08-10 DIAGNOSIS — M542 Cervicalgia: Secondary | ICD-10-CM

## 2019-08-10 DIAGNOSIS — M25511 Pain in right shoulder: Secondary | ICD-10-CM

## 2019-08-10 DIAGNOSIS — R293 Abnormal posture: Secondary | ICD-10-CM
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
## 2019-08-12 ENCOUNTER — Ambulatory Visit: Payer: Medicare Other | Admitting: Physical Therapy

## 2019-09-17 ENCOUNTER — Other Ambulatory Visit: Payer: Self-pay | Admitting: Internal Medicine

## 2019-10-24 IMAGING — CR DG LUMBAR SPINE 2-3V
3 series · 3 of 3 positions shown · non-contrast
Comparison: None.

CLINICAL DATA: Low back pain, left hip pain

EXAM:
LUMBAR SPINE - 2-3 VIEW

[t lumbar spine ap]
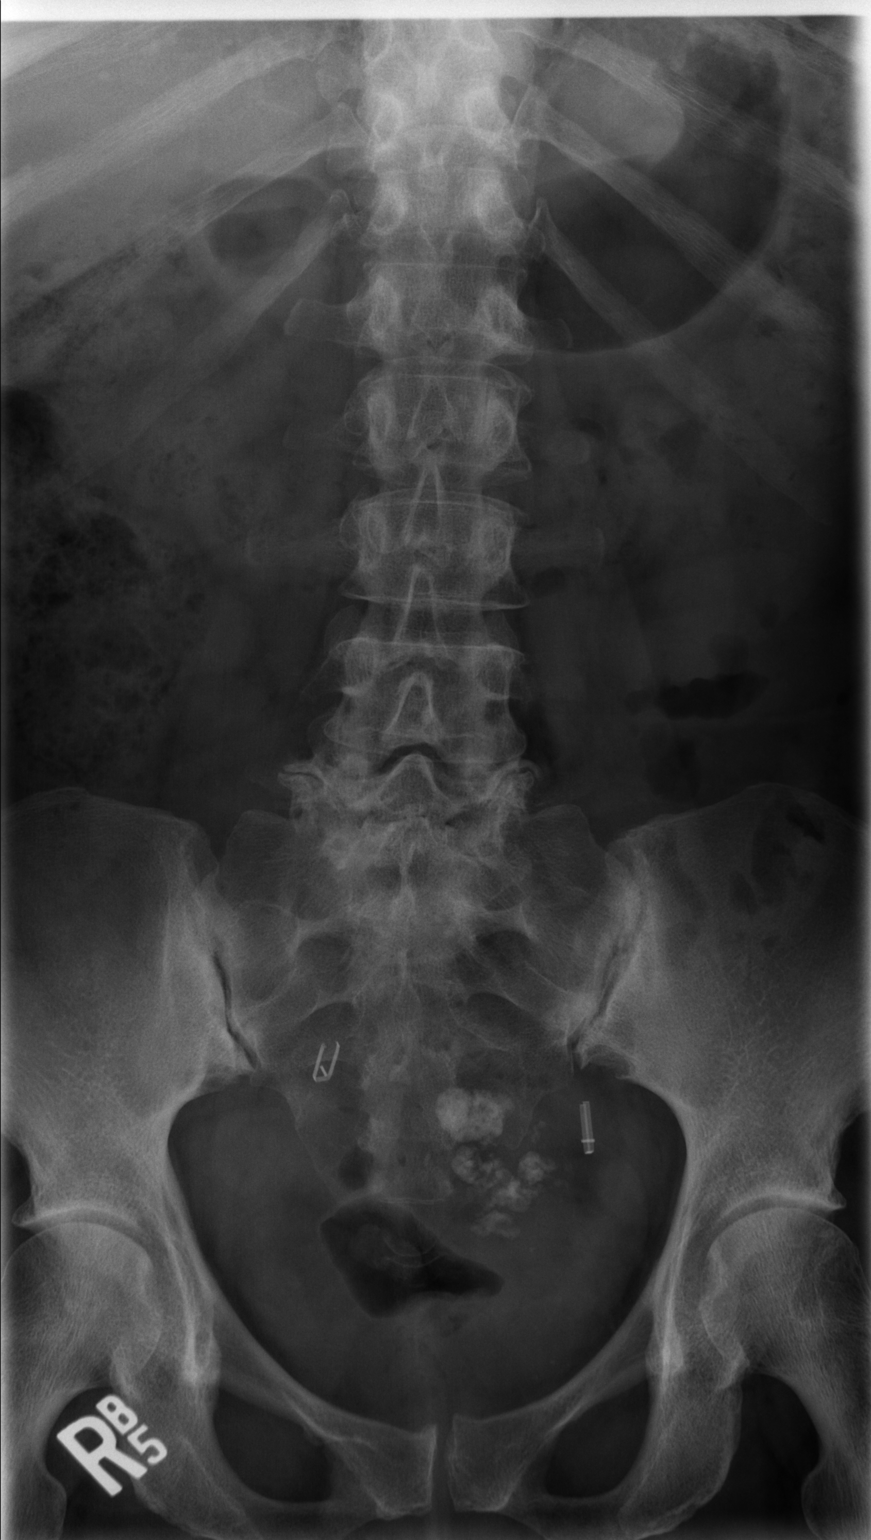

[t lumbar spine lat]
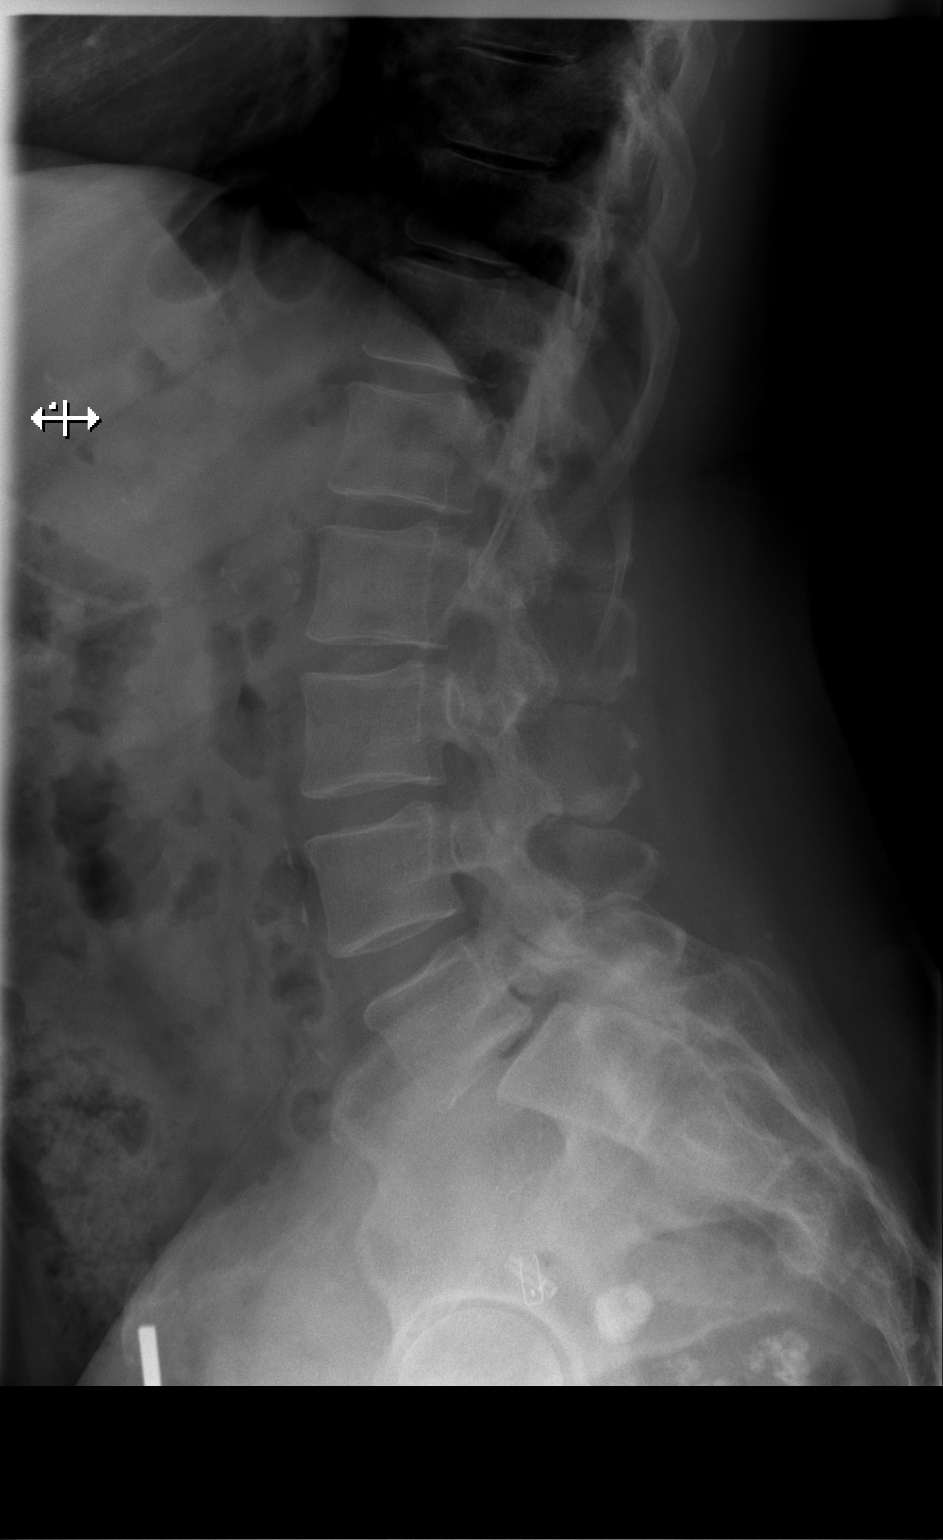

[t lumbar l-5 s-1 spot]
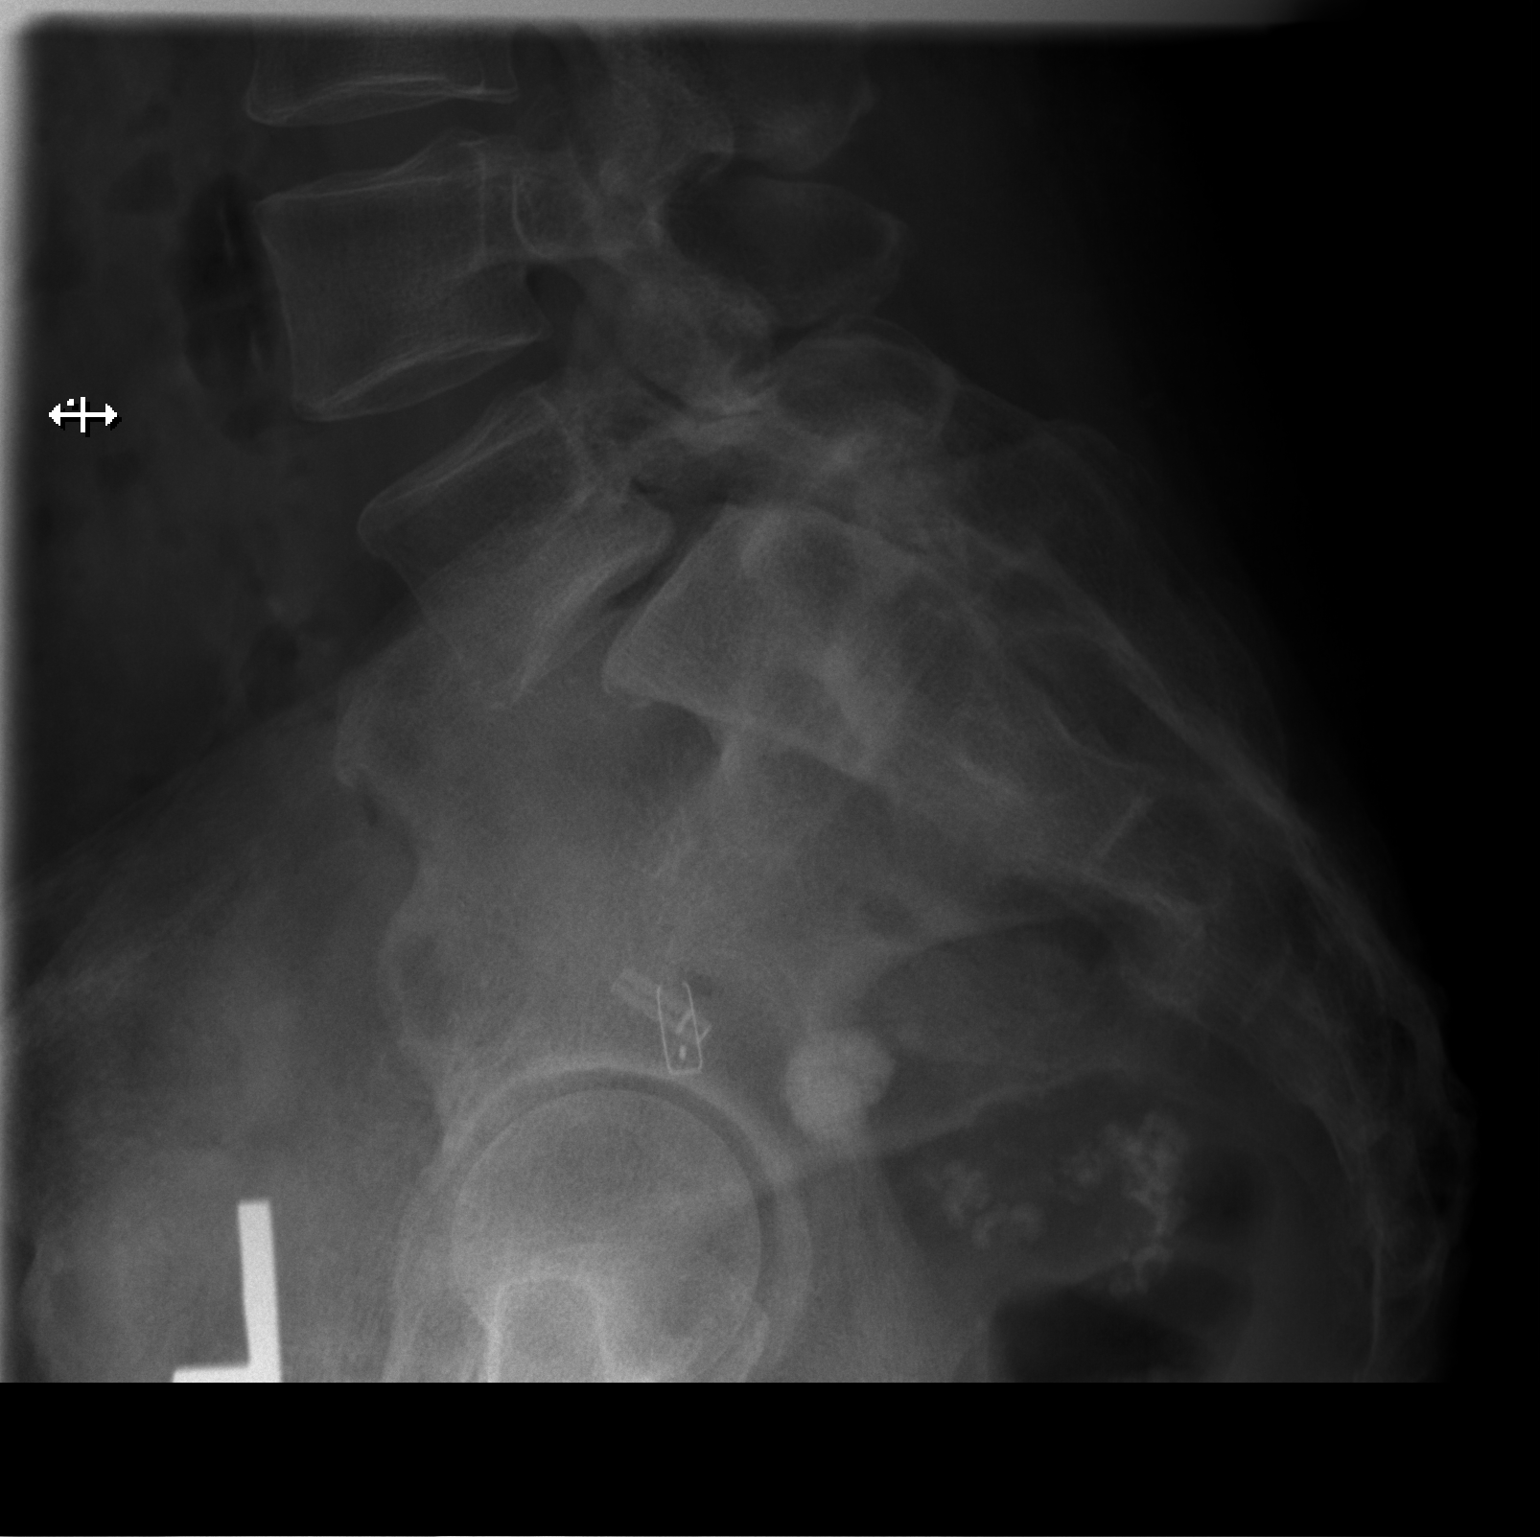

[3 of 3 positions shown; findings below may reference images not displayed]

FINDINGS: 10 mm of anterolisthesis of L5 on S1 likely related to advanced
degenerative facet disease. Degenerative disc disease at L5-S1 with
disc space narrowing and vacuum disc. No fracture. SI joints
symmetric and unremarkable. Calcified fibroid centrally in the
pelvis. Bilateral tubal ligation clips present.
IMPRESSION: Grade 2 anterolisthesis of L5 on S1 related to advanced facet
disease. Degenerative disc disease at this level as well.

## 2019-11-24 ENCOUNTER — Telehealth: Payer: Self-pay
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
## 2020-01-27 ENCOUNTER — Ambulatory Visit (INDEPENDENT_AMBULATORY_CARE_PROVIDER_SITE_OTHER): Payer: Medicare Other | Admitting: Internal Medicine

## 2020-01-27 ENCOUNTER — Other Ambulatory Visit: Payer: Self-pay

## 2020-01-27 ENCOUNTER — Encounter: Payer: Self-pay | Admitting: Internal Medicine

## 2020-01-27 VITALS — BP 146/82 | HR 95 | Temp 98.3°F | Ht 61.0 in | Wt 148.0 lb

## 2020-01-27 DIAGNOSIS — E785 Hyperlipidemia, unspecified: Secondary | ICD-10-CM | POA: Diagnosis not present

## 2020-01-27 DIAGNOSIS — M5440 Lumbago with sciatica, unspecified side: Secondary | ICD-10-CM | POA: Diagnosis not present

## 2020-01-27 DIAGNOSIS — R7989 Other specified abnormal findings of blood chemistry: Secondary | ICD-10-CM | POA: Diagnosis not present

## 2020-01-27 DIAGNOSIS — M7631 Iliotibial band syndrome, right leg: Secondary | ICD-10-CM | POA: Diagnosis not present

## 2020-01-27 DIAGNOSIS — G8929 Other chronic pain: Secondary | ICD-10-CM | POA: Diagnosis not present

## 2020-01-27 DIAGNOSIS — K7400 Hepatic fibrosis, unspecified: Secondary | ICD-10-CM

## 2020-01-27 DIAGNOSIS — I10 Essential (primary) hypertension: Secondary | ICD-10-CM | POA: Diagnosis not present

## 2020-01-27 DIAGNOSIS — M25512 Pain in left shoulder: Secondary | ICD-10-CM

## 2020-01-27 LAB — BASIC METABOLIC PANEL
BUN: 15 mg/dL (ref 6–23)
CO2: 30 mEq/L (ref 19–32)
Calcium: 10.8 mg/dL — ABNORMAL HIGH (ref 8.4–10.5)
Chloride: 98 mEq/L (ref 96–112)
Creatinine, Ser: 0.88 mg/dL (ref 0.40–1.20)
GFR: 77.41 mL/min (ref >60.00)
Glucose, Bld: 96 mg/dL (ref 70–99)
Potassium: 3.6 mEq/L (ref 3.5–5.1)
Sodium: 135 mEq/L (ref 135–145)

## 2020-01-27 LAB — HEPATIC FUNCTION PANEL
ALT: 17 U/L (ref 0–35)
AST: 21 U/L (ref 0–37)
Albumin: 4.5 g/dL (ref 3.5–5.2)
Alkaline Phosphatase: 48 U/L (ref 39–117)
Bilirubin, Direct: 0.1 mg/dL (ref 0.0–0.3)
Total Bilirubin: 0.4 mg/dL (ref 0.2–1.2)
Total Protein: 8 g/dL (ref 6.0–8.3)

## 2020-01-27 LAB — TSH: TSH: 0.33 u[IU]/mL — ABNORMAL LOW (ref 0.35–4.50)

## 2020-01-27 NOTE — Assessment & Plan Note (Signed)
[place]  BP Readings from Last 3 Encounters:  01/27/20 (!) 146/82  06/22/19 (!) 170/90  05/27/19 130/78

## 2020-01-27 NOTE — Patient Instructions (Signed)
Iliotibial Band Syndrome  Iliotibial band syndrome (ITBS) is a condition that often causes knee pain. It can also cause pain in the outside of your hip, thigh, and knee. The iliotibial band is a strip of tissue that runs from the outside of your hip and down your thigh to the outside of your knee. Repeatedly bending and straightening your knee can irritate the iliotibial band. What are the causes? This condition is caused by inflammation and irritation from the friction of the iliotibial band moving over the thigh bone (femur) when you repeatedly bend and straighten your knee. What increases the risk? This condition is more likely to develop in people who:  Frequently change elevation during their workouts.  Run very long distances.  Recently increased the length or intensity of their workouts.  Run downhill often, or just started running downhill.  Ride a bike very far or often. You may also be at greater risk if you start a new workout routine without first warming up or if you have a job that requires you to bend, squat, or climb frequently. What are the signs or symptoms? Symptoms of this condition include:  Pain along the outside of your knee that may be worse with activity, especially running or going up and down stairs.  A "snapping" sensation over your knee.  Swelling on the outside of your knee.  Pain or a feeling of tightness in your hip. How is this diagnosed? This condition is diagnosed based on your symptoms, medical history, and physical exam. You may also see a health care provider who specializes in reducing pain and increasing mobility (physical therapist). A physical therapist may do an exam to check your balance, movement, and way of walking or running (gait) to see whether the way you move could contribute to your injury. You may also have tests to measure your strength, flexibility, and range of motion. How is this treated? Treatment for this condition  includes:  Resting and limiting exercise.  Returning to activities gradually.  Doing range-of-motion and strengthening exercises (physical therapy) as told by your health care provider.  Including low-impact activities, such as swimming, in your exercise routine. Follow these instructions at home:  If directed, apply ice to the injured area. ? Put ice in a plastic bag. ? Place a towel between your skin and the bag. ? Leave the ice on for 20 minutes, 2-3 times per day.  Return to your normal activities as told by your health care provider. Ask your health care provider what activities are safe for you.  Keep all follow-up visits with your health care provider. This is important. Contact a health care provider if:  Your pain does not improve or gets worse despite treatment. This information is not intended to replace advice given to you by your health care provider. Make sure you discuss any questions you have with your health care provider. Document Revised: 10/25/2017 Document Reviewed: 12/14/2016 Elsevier Patient Education  2020 Elsevier Inc.  

## 2020-01-27 NOTE — Assessment & Plan Note (Signed)
MSK Stretch

## 2020-01-27 NOTE — Assessment & Plan Note (Signed)
Better  

## 2020-01-27 NOTE — Assessment & Plan Note (Signed)
Labs

## 2020-01-27 NOTE — Assessment & Plan Note (Signed)
F/u w/GI LFTs

## 2020-01-27 NOTE — Assessment & Plan Note (Signed)
Acute Ice, hip opener exercises
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
## 2020-01-28 ENCOUNTER — Other Ambulatory Visit (INDEPENDENT_AMBULATORY_CARE_PROVIDER_SITE_OTHER): Payer: Medicare Other

## 2020-01-28 DIAGNOSIS — R7989 Other specified abnormal findings of blood chemistry: Secondary | ICD-10-CM

## 2020-01-28 LAB — T4, FREE: Free T4: 0.85 ng/dL (ref 0.60–1.60)

## 2020-02-08 ENCOUNTER — Encounter: Payer: Self-pay | Admitting: Internal Medicine

## 2020-02-10 ENCOUNTER — Other Ambulatory Visit: Payer: Self-pay

## 2020-02-10 ENCOUNTER — Ambulatory Visit (INDEPENDENT_AMBULATORY_CARE_PROVIDER_SITE_OTHER): Payer: Medicare Other | Admitting: Internal Medicine

## 2020-02-10 ENCOUNTER — Encounter: Payer: Self-pay | Admitting: Internal Medicine

## 2020-02-10 DIAGNOSIS — M7631 Iliotibial band syndrome, right leg: Secondary | ICD-10-CM

## 2020-02-10 MED ORDER — IBUPROFEN 600 MG PO TABS: ORAL_TABLET | ORAL | 2 refills | Status: DC

## 2020-02-10 MED ORDER — LIDOCAINE-EPINEPHRINE 2 %-1:100000 IJ SOLN
60.0000 mL | Freq: Once | INTRAMUSCULAR | Status: AC
  Administered 2020-02-10: 60 mL

## 2020-02-10 MED ORDER — METHOCARBAMOL 500 MG PO TABS: 500.0000 mg | ORAL_TABLET | Freq: Three times a day (TID) | ORAL | 1 refills | Status: DC | PRN

## 2020-02-10 MED ORDER — METHYLPREDNISOLONE ACETATE 80 MG/ML IJ SUSP
80.0000 mg | Freq: Once | INTRAMUSCULAR | Status: AC
  Administered 2020-02-10: 80 mg via INTRAMUSCULAR
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
## 2020-02-10 NOTE — Patient Instructions (Signed)
Postprocedure instructions :    A Band-Aid should be left on for 12 hours. Injection therapy is not a cure itself. It is used in conjunction with other modalities. You can use nonsteroidal anti-inflammatories like ibuprofen , hot and cold compresses. Rest is recommended in the next 24 hours. You need to report immediately  if fever, chills or any signs of infection develop. 

## 2020-02-10 NOTE — Assessment & Plan Note (Signed)
Not better Will inject Ibuprofen Robaxin  Stretching

## 2020-02-16 ENCOUNTER — Encounter: Payer: Self-pay | Admitting: Internal Medicine

## 2020-04-13 ENCOUNTER — Telehealth: Payer: Self-pay | Admitting: Internal Medicine

## 2020-04-13 DIAGNOSIS — Z1231 Encounter for screening mammogram for malignant neoplasm of breast: Secondary | ICD-10-CM | POA: Diagnosis not present

## 2020-04-13 DIAGNOSIS — Z6828 Body mass index (BMI) 28.0-28.9, adult: Secondary | ICD-10-CM | POA: Diagnosis not present

## 2020-04-13 DIAGNOSIS — Z01419 Encounter for gynecological examination (general) (routine) without abnormal findings: Secondary | ICD-10-CM | POA: Diagnosis not present

## 2020-04-13 DIAGNOSIS — N958 Other specified menopausal and perimenopausal disorders: Secondary | ICD-10-CM | POA: Diagnosis not present

## 2020-04-13 NOTE — Telephone Encounter (Signed)
New message:   Pt is calling and states Dr. Rana Snare wanted her to let us know that her BP is 150/90. She states she was getting her annual mammogram/physical. Please advise.

## 2020-04-13 NOTE — Telephone Encounter (Signed)
Please advise 

## 2020-04-15 NOTE — Telephone Encounter (Signed)
Please measure and record your blood pressure follow-up with me in 1-2 weeks.

## 2020-04-15 NOTE — Telephone Encounter (Signed)
Spoke with patient today and info given. She will call back to schedule appointment.

## 2020-04-26 ENCOUNTER — Ambulatory Visit (INDEPENDENT_AMBULATORY_CARE_PROVIDER_SITE_OTHER): Payer: Medicare Other | Admitting: Internal Medicine

## 2020-04-26 ENCOUNTER — Other Ambulatory Visit: Payer: Self-pay

## 2020-04-26 ENCOUNTER — Encounter: Payer: Self-pay | Admitting: Internal Medicine

## 2020-04-26 DIAGNOSIS — J301 Allergic rhinitis due to pollen: Secondary | ICD-10-CM | POA: Diagnosis not present

## 2020-04-26 DIAGNOSIS — I10 Essential (primary) hypertension: Secondary | ICD-10-CM

## 2020-04-26 MED ORDER — CETIRIZINE HCL 10 MG PO TABS: 10.0000 mg | ORAL_TABLET | Freq: Every day | ORAL | 3 refills | Status: DC

## 2020-04-26 MED ORDER — AMLODIPINE BESYLATE 5 MG PO TABS: 5.0000 mg | ORAL_TABLET | Freq: Every day | ORAL | 3 refills | Status: DC

## 2020-04-26 MED ORDER — FLUTICASONE PROPIONATE 50 MCG/ACT NA SUSP: NASAL | 11 refills | Status: DC

## 2020-04-26 NOTE — Assessment & Plan Note (Signed)
Micardis 40 mg/d - not too well controlled. We added Norvasc 5 mg/d
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
## 2020-04-26 NOTE — Assessment & Plan Note (Signed)
Worse - Re-start Flonase, Zyrtec

## 2020-05-01 ENCOUNTER — Other Ambulatory Visit: Payer: Self-pay | Admitting: Internal Medicine

## 2020-05-31 ENCOUNTER — Encounter: Payer: Self-pay | Admitting: Internal Medicine

## 2020-05-31 ENCOUNTER — Ambulatory Visit (INDEPENDENT_AMBULATORY_CARE_PROVIDER_SITE_OTHER): Payer: Medicare Other | Admitting: Internal Medicine

## 2020-05-31 ENCOUNTER — Other Ambulatory Visit: Payer: Self-pay

## 2020-05-31 DIAGNOSIS — R7989 Other specified abnormal findings of blood chemistry: Secondary | ICD-10-CM | POA: Diagnosis not present

## 2020-05-31 DIAGNOSIS — I10 Essential (primary) hypertension: Secondary | ICD-10-CM

## 2020-05-31 LAB — BASIC METABOLIC PANEL
BUN: 12 mg/dL (ref 6–23)
CO2: 27 mEq/L (ref 19–32)
Calcium: 9.9 mg/dL (ref 8.4–10.5)
Chloride: 104 mEq/L (ref 96–112)
Creatinine, Ser: 0.73 mg/dL (ref 0.40–1.20)
GFR: 95.94 mL/min (ref >60.00)
Glucose, Bld: 104 mg/dL — ABNORMAL HIGH (ref 70–99)
Potassium: 3.6 mEq/L (ref 3.5–5.1)
Sodium: 139 mEq/L (ref 135–145)

## 2020-05-31 LAB — TSH: TSH: 0.32 u[IU]/mL — ABNORMAL LOW (ref 0.35–4.50)

## 2020-05-31 LAB — T4, FREE: Free T4: 0.84 ng/dL (ref 0.60–1.60)

## 2020-05-31 MED ORDER — SHINGRIX 50 MCG/0.5ML IM SUSR: 0.5000 mL | Freq: Once | INTRAMUSCULAR | 1 refills | Status: AC

## 2020-05-31 MED ORDER — IPRATROPIUM BROMIDE 0.06 % NA SOLN: NASAL | 2 refills | Status: DC

## 2020-05-31 NOTE — Assessment & Plan Note (Signed)
Micardis 40 mg/d Norvasc 5 mg/d
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
## 2020-05-31 NOTE — Assessment & Plan Note (Signed)
Labs

## 2020-07-04 ENCOUNTER — Telehealth: Payer: Self-pay

## 2020-07-04 NOTE — Telephone Encounter (Signed)
1.Medication Requested:telmisartan (MICARDIS) 40 MG tablet  2. Pharmacy (Name, Street, City):[id number] - [place], [place] - [address]  3. On Med List: yES   4. Last Visit with PCP:  7.6.21   5. Next visit date with PCP: N/A   Agent: Please be advised that RX refills may take up to 3 business days. We ask that you follow-up with your pharmacy.

## 2020-07-06 ENCOUNTER — Other Ambulatory Visit: Payer: Self-pay

## 2020-07-06 ENCOUNTER — Telehealth: Payer: Self-pay

## 2020-07-06 MED ORDER — TELMISARTAN 40 MG PO TABS: 40.0000 mg | ORAL_TABLET | Freq: Every day | ORAL | 3 refills | Status: DC

## 2020-07-06 NOTE — Telephone Encounter (Deleted)
error 

## 2020-07-06 NOTE — Telephone Encounter (Signed)
F/u   The patient is asking for the CMA to call her back to discuss blood pressure medication and cost

## 2020-07-07 NOTE — Telephone Encounter (Signed)
Pt states that her insurance called the pharmacy and straightened everything out

## 2020-07-07 NOTE — Telephone Encounter (Signed)
Follow up message    Patient states she discussed cost issue with pharmacy. The issue  with cost was corrected. No action needed from office Disregard previous encounter

## 2020-08-13 DIAGNOSIS — Z23 Encounter for immunization: Secondary | ICD-10-CM | POA: Diagnosis not present

## 2020-09-14 ENCOUNTER — Ambulatory Visit (INDEPENDENT_AMBULATORY_CARE_PROVIDER_SITE_OTHER): Payer: Medicare Other

## 2020-09-14 ENCOUNTER — Other Ambulatory Visit: Payer: Self-pay

## 2020-09-14 VITALS — BP 150/80 | HR 90 | Temp 98.3°F | Ht 61.0 in | Wt 151.0 lb

## 2020-09-14 DIAGNOSIS — Z0000 Encounter for general adult medical examination without abnormal findings: Secondary | ICD-10-CM

## 2020-09-14 NOTE — Patient Instructions (Addendum)
Dana Long , Thank you for taking time to come for your Medicare Wellness Visit. I appreciate your ongoing commitment to your health goals. Please review the following plan we discussed and let me know if I can assist you in the future.   Screening recommendations/referrals: Colonoscopy: 03/06/2016; due every 10 years Mammogram: 05/2020 at [facility] (per patient) Bone Density: 05/2020 at [facility] (per patient) Recommended yearly ophthalmology/optometry visit for glaucoma screening and checkup Recommended yearly dental visit for hygiene and checkup  Vaccinations: Influenza vaccine: 07/2020 at [facility] (per patient) Pneumococcal vaccine: need Pneumovax 23 Tdap vaccine: 12/22/2017 Shingles vaccine: never done   Covid-19: up to date  Advanced directives: Advance directive discussed with you today. I have provided a copy for you to complete at home and have notarized. Once this is complete please bring a copy in to our office so we can scan it into your chart.  Conditions/risks identified: Yes; Reviewed health maintenance screenings with patient today and relevant education, vaccines, and/or referrals were provided. Please continue to do your personal lifestyle choices by: daily care of teeth and gums, regular physical activity (goal should be 5 days a week for 30 minutes), eat a healthy diet, avoid tobacco and drug use, limiting any alcohol intake, taking a low-dose aspirin (if not allergic or have been advised by your provider otherwise) and taking vitamins and minerals as recommended by your provider. Continue doing brain stimulating activities (puzzles, reading, adult coloring books, staying active) to keep memory sharp. Continue to eat heart healthy diet (full of fruits, vegetables, whole grains, lean protein, water--limit salt, fat, and sugar intake) and increase physical activity as tolerated.  Next appointment: Please schedule your next Medicare Wellness Visit with your Nurse  Health Advisor in 1 year by calling [phone].  Preventive Care 47 Years and Older, Female Preventive care refers to lifestyle choices and visits with your health care provider that can promote health and wellness. What does preventive care include?  A yearly physical exam. This is also called an annual well check.  Dental exams once or twice a year.  Routine eye exams. Ask your health care provider how often you should have your eyes checked.  Personal lifestyle choices, including:  Daily care of your teeth and gums.  Regular physical activity.  Eating a healthy diet.  Avoiding tobacco and drug use.  Limiting alcohol use.  Practicing safe sex.  Taking low-dose aspirin every day.  Taking vitamin and mineral supplements as recommended by your health care provider. What happens during an annual well check? The services and screenings done by your health care provider during your annual well check will depend on your age, overall health, lifestyle risk factors, and family history of disease. Counseling  Your health care provider may ask you questions about your:  Alcohol use.  Tobacco use.  Drug use.  Emotional well-being.  Home and relationship well-being.  Sexual activity.  Eating habits.  History of falls.  Memory and ability to understand (cognition).  Work and work Astronomer.  Reproductive health. Screening  You may have the following tests or measurements:  Height, weight, and BMI.  Blood pressure.  Lipid and cholesterol levels. These may be checked every 5 years, or more frequently if you are over 32 years old.  Skin check.  Lung cancer screening. You may have this screening every year starting at age 79 if you have a 30-pack-year history of smoking and currently smoke or have quit within the past 15 years.  Fecal occult blood test (FOBT) of the stool. You may have this test every year starting at age 43.  Flexible sigmoidoscopy or  colonoscopy. You may have a sigmoidoscopy every 5 years or a colonoscopy every 10 years starting at age 45.  Hepatitis C blood test.  Hepatitis B blood test.  Sexually transmitted disease (STD) testing.  Diabetes screening. This is done by checking your blood sugar (glucose) after you have not eaten for a while (fasting). You may have this done every 1-3 years.  Bone density scan. This is done to screen for osteoporosis. You may have this done starting at age 46.  Mammogram. This may be done every 1-2 years. Talk to your health care provider about how often you should have regular mammograms. Talk with your health care provider about your test results, treatment options, and if necessary, the need for more tests. Vaccines  Your health care provider may recommend certain vaccines, such as:  Influenza vaccine. This is recommended every year.  Tetanus, diphtheria, and acellular pertussis (Tdap, Td) vaccine. You may need a Td booster every 10 years.  Zoster vaccine. You may need this after age 96.  Pneumococcal 13-valent conjugate (PCV13) vaccine. One dose is recommended after age 3.  Pneumococcal polysaccharide (PPSV23) vaccine. One dose is recommended after age 42. Talk to your health care provider about which screenings and vaccines you need and how often you need them. This information is not intended to replace advice given to you by your health care provider. Make sure you discuss any questions you have with your health care provider. Document Released: 12/09/2015 Document Revised: 08/01/2016 Document Reviewed: 09/13/2015 Elsevier Interactive Patient Education  2017 [employer].  Fall Prevention in the Home Falls can cause injuries. They can happen to people of all ages. There are many things you can do to make your home safe and to help prevent falls. What can I do on the outside of my home?  Regularly fix the edges of walkways and driveways and fix any cracks.  Remove  anything that might make you trip as you walk through a door, such as a raised step or threshold.  Trim any bushes or trees on the path to your home.  Use bright outdoor lighting.  Clear any walking paths of anything that might make someone trip, such as rocks or tools.  Regularly check to see if handrails are loose or broken. Make sure that both sides of any steps have handrails.  Any raised decks and porches should have guardrails on the edges.  Have any leaves, snow, or ice cleared regularly.  Use sand or salt on walking paths during winter.  Clean up any spills in your garage right away. This includes oil or grease spills. What can I do in the bathroom?  Use night lights.  Install grab bars by the toilet and in the tub and shower. Do not use towel bars as grab bars.  Use non-skid mats or decals in the tub or shower.  If you need to sit down in the shower, use a plastic, non-slip stool.  Keep the floor dry. Clean up any water that spills on the floor as soon as it happens.  Remove soap buildup in the tub or shower regularly.  Attach bath mats securely with double-sided non-slip rug tape.  Do not have throw rugs and other things on the floor that can make you trip. What can I do in the bedroom?  Use night lights.  Make sure that  you have a light by your bed that is easy to reach.  Do not use any sheets or blankets that are too big for your bed. They should not hang down onto the floor.  Have a firm chair that has side arms. You can use this for support while you get dressed.  Do not have throw rugs and other things on the floor that can make you trip. What can I do in the kitchen?  Clean up any spills right away.  Avoid walking on wet floors.  Keep items that you use a lot in easy-to-reach places.  If you need to reach something above you, use a strong step stool that has a grab bar.  Keep electrical cords out of the way.  Do not use floor polish or wax that  makes floors slippery. If you must use wax, use non-skid floor wax.  Do not have throw rugs and other things on the floor that can make you trip. What can I do with my stairs?  Do not leave any items on the stairs.  Make sure that there are handrails on both sides of the stairs and use them. Fix handrails that are broken or loose. Make sure that handrails are as long as the stairways.  Check any carpeting to make sure that it is firmly attached to the stairs. Fix any carpet that is loose or worn.  Avoid having throw rugs at the top or bottom of the stairs. If you do have throw rugs, attach them to the floor with carpet tape.  Make sure that you have a light switch at the top of the stairs and the bottom of the stairs. If you do not have them, ask someone to add them for you. What else can I do to help prevent falls?  Wear shoes that:  Do not have high heels.  Have rubber bottoms.  Are comfortable and fit you well.  Are closed at the toe. Do not wear sandals.  If you use a stepladder:  Make sure that it is fully opened. Do not climb a closed stepladder.  Make sure that both sides of the stepladder are locked into place.  Ask someone to hold it for you, if possible.  Clearly mark and make sure that you can see:  Any grab bars or handrails.  First and last steps.  Where the edge of each step is.  Use tools that help you move around (mobility aids) if they are needed. These include:  Canes.  Walkers.  Scooters.  Crutches.  Turn on the lights when you go into a dark area. Replace any light bulbs as soon as they burn out.  Set up your furniture so you have a clear path. Avoid moving your furniture around.  If any of your floors are uneven, fix them.  If there are any pets around you, be aware of where they are.  Review your medicines with your doctor. Some medicines can make you feel dizzy. This can increase your chance of falling. Ask your doctor what other  things that you can do to help prevent falls. This information is not intended to replace advice given to you by your health care provider. Make sure you discuss any questions you have with your health care provider. Document Released: 09/08/2009 Document Revised: 04/19/2016 Document Reviewed: 12/17/2014 Elsevier Interactive Patient Education  2017 [employer].

## 2020-09-14 NOTE — Progress Notes (Addendum)
Subjective:   Dana Long is a 69 y.o. female who presents for Medicare Annual (Subsequent) preventive examination.  Review of Systems    No ROS.Medicare Wellness Visit Cardiac Risk Factors include: advanced age (>28men, >9 women);family history of premature cardiovascular disease;hypertension     Objective:    Today's Vitals   09/14/20 0847  BP: (!) 150/80  Pulse: 90  Temp: 98.3 F (36.8 C)  SpO2: 97%  Weight: 151 lb (68.5 kg)  Height: 5\' 1"  (1.549 m)  PainSc: 0-No pain   Body mass index is 28.53 kg/m.  Advanced Directives 09/14/2020 07/15/2019 08/31/2018 02/21/2016  Does Patient Have a Medical Advance Directive? No No No No  Would patient like information on creating a medical advance directive? Yes (MAU/Ambulatory/Procedural Areas - Information given) No - Patient declined - No - patient declined information    Current Medications (verified) Outpatient Encounter Medications as of 09/14/2020  Medication Sig   amLODipine (NORVASC) 5 MG tablet Take 1 tablet (5 mg total) by mouth daily.   fluticasone (FLONASE) 50 MCG/ACT nasal spray USE 2 SPRAYS INTO EACH NOSTRIL EVERY DAY   telmisartan (MICARDIS) 40 MG tablet Take 1 tablet (40 mg total) by mouth daily.   Calcium-Phosphorus-Vitamin D (CITRACAL +D3 PO) Take 1 each by mouth daily. Reported on 02/21/2016   cetirizine (ZYRTEC) 10 MG tablet Take 1 tablet (10 mg total) by mouth daily.   cyclobenzaprine (FLEXERIL) 5 MG tablet cyclobenzaprine 5 mg tablet  TAKE 1 TABLET BY MOUTH THREE TIMES A DAY AS NEEDED FOR MUSCLE SPASMS   diphenhydrAMINE (BENADRYL ALLERGY) 25 MG tablet Take 1 tablet (25 mg total) by mouth every 8 (eight) hours as needed for allergies.   ibuprofen (ADVIL) 600 MG tablet TAKE TWICE A DAY X 1 WEEK, THEN AS NEEDED FOR PAIN   ipratropium (ATROVENT) 0.06 % nasal spray USE 2 SPRAYS IN EACH NOSTRIL 3 TIMES A DAY   No facility-administered encounter medications on file as of 09/14/2020.    Allergies  (verified) Maxzide [hydrochlorothiazide w-triamterene]   History: Past Medical History:  Diagnosis Date   GERD (gastroesophageal reflux disease)    Gout    ?   Hypertension    Past Surgical History:  Procedure Laterality Date   TUBAL LIGATION     Family History  Problem Relation Age of Onset   Gout Father    Diabetes Mother    Diabetes Sister    Diabetes Brother    Diabetes Other    Hypertension Other    Colon cancer Neg Hx    Social History   Socioeconomic History   Marital status: Single    Spouse name: Not on file   Number of children: Not on file   Years of education: Not on file   Highest education level: Not on file  Occupational History   Not on file  Tobacco Use   Smoking status: Former Smoker   Smokeless tobacco: Never Used  Substance and Sexual Activity   Alcohol use: No    Alcohol/week: 0.0 standard drinks   Drug use: No   Sexual activity: Yes  Other Topics Concern   Not on file  Social History Narrative   Not on file   Social Determinants of Health   Financial Resource Strain: Low Risk    Difficulty of Paying Living Expenses: Not hard at all  Food Insecurity: No Food Insecurity   Worried About 09/16/2020 in the Last Year: Never true   Programme researcher, broadcasting/film/video of [employer]  in the Last Year: Never true  Transportation Needs: No Transportation Needs   Lack of Transportation (Medical): No   Lack of Transportation (Non-Medical): No  Physical Activity: Sufficiently Active   Days of Exercise per Week: 5 days   Minutes of Exercise per Session: 30 min  Stress: No Stress Concern Present   Feeling of Stress : Not at all  Social Connections: Moderately Integrated   Frequency of Communication with Friends and Family: More than three times a week   Frequency of Social Gatherings with Friends and Family: More than three times a week   Attends Religious Services: More than 4 times per year   Active Member of [employer] or Organizations: Yes   Attends BankerClub or Organization  Meetings: More than 4 times per year   Marital Status: Widowed    Tobacco Counseling Counseling given: Not Answered   Clinical Intake:  Pre-visit preparation completed: Yes  Pain : No/denies pain Pain Score: 0-No pain     BMI - recorded: 28.53 Nutritional Status: BMI 25 -29 Overweight Nutritional Risks: None Diabetes: No  How often do you need to have someone help you when you read instructions, pamphlets, or other written materials from your doctor or pharmacy?: 1 - Never What is the last grade level you completed in school?: HSG  Diabetic? no  Interpreter Needed?: No  Information entered by :: Dana CassetteShenika Tarrence Enck, LPN   Activities of Daily Living In your present state of health, do you have any difficulty performing the following activities: 09/14/2020  Hearing? N  Vision? N  Difficulty concentrating or making decisions? N  Walking or climbing stairs? N  Dressing or bathing? N  Doing errands, shopping? N  Preparing Food and eating ? N  Using the Toilet? N  In the past six months, have you accidently leaked urine? N  Do you have problems with loss of bowel control? N  Managing your Medications? N  Managing your Finances? N  Housekeeping or managing your Housekeeping? N  Some recent data might be hidden    Patient Care Team: Long, Dana QuintAleksei V, MD as PCP - General (Internal Medicine) Gennie Long, Dana Cruz, CNM (Inactive) as Midwife (Obstetrics and Gynecology)  Indicate any recent Medical Services you may have received from other than [facility] providers in the past year (date may be approximate).     Assessment:   This is a routine wellness examination for Dana Long.  Hearing/Vision screen No exam data present  Dietary issues and exercise activities discussed: Current Exercise Habits: The patient has a physically strenuous job, but has no regular exercise apart from work., Exercise limited by: None identified  Goals   None    Depression Screen PHQ 2/9 Scores  09/14/2020 05/27/2019 12/24/2017 04/08/2017 03/20/2016 05/02/2015 04/04/2015  PHQ - 2 Score 0 0 0 0 0 0 0    Fall Risk Fall Risk  09/14/2020 05/27/2019 12/24/2017 03/20/2016 05/02/2015  Falls in the past year? 0 0 No No No  Number falls in past yr: 0 - - - -  Injury with Fall? 0 - - - -  Risk for fall due to : No Fall Risks - - - -  Follow up Falls evaluation completed Falls evaluation completed - - -    Any stairs in or around the home? Yes  If so, are there any without handrails? No  Home free of loose throw rugs in walkways, pet beds, electrical cords, etc? Yes  Adequate lighting in your home to reduce risk of falls? Yes  ASSISTIVE DEVICES UTILIZED TO PREVENT FALLS:  Life alert? No  Use of a cane, walker or w/c? No  Grab bars in the bathroom? No  Shower chair or bench in shower? No  Elevated toilet seat or a handicapped toilet? No   TIMED UP AND GO:  Was the test performed? No .  Length of time to ambulate 10 feet: 0 sec.   Gait steady and fast without use of assistive device  Cognitive Function:     6CIT Screen 09/14/2020  What Year? 0 points  What month? 0 points  What time? 0 points  Count back from 20 0 points  Months in reverse 0 points  Repeat phrase 0 points  Total Score 0    Immunizations Immunization History  Administered Date(s) Administered   Fluad Quad(high Dose 65+) 07/29/2019   Hepatitis B, adult 04/04/2015, 05/02/2015, 03/20/2016   Influenza Split 09/06/2016, 07/24/2017   Influenza,inj,Quad PF,6+ Mos 11/11/2013, 11/16/2014, 08/26/2015   Influenza-Unspecified 07/17/2018   PFIZER SARS-COV-2 Vaccination 12/18/2019, 01/08/2020   Pneumococcal Conjugate-13 07/11/2018   Tdap 11/14/2012, 12/22/2017   Zoster 06/16/2013    TDAP status: Up to date Flu Vaccine status: Up to date Pneumococcal vaccine status: Up to date  (need Pneumovax 23) Covid-19 vaccine status: Completed vaccines  Qualifies for Shingles Vaccine? Yes   Zostavax completed Yes   Shingrix  Completed?: No.    Education has been provided regarding the importance of this vaccine. Patient has been advised to call insurance company to determine out of pocket expense if they have not yet received this vaccine. Advised may also receive vaccine at local pharmacy or Health Dept. Verbalized acceptance and understanding.  Screening Tests Health Maintenance  Topic Date Due   PNA vac Low Risk Adult (2 of 2 - PPSV23) 07/12/2019   INFLUENZA VACCINE  06/26/2020   MAMMOGRAM  03/26/2021   COLONOSCOPY  03/06/2026   TETANUS/TDAP  12/23/2027   DEXA SCAN  Completed   COVID-19 Vaccine  Completed   Hepatitis C Screening  Completed    Health Maintenance  Health Maintenance Due  Topic Date Due   PNA vac Low Risk Adult (2 of 2 - PPSV23) 07/12/2019   INFLUENZA VACCINE  06/26/2020    Colorectal cancer screening: Completed 03/06/2016. Repeat every 10 years Mammogram status: Completed 05/2020. Repeat every year Bone Density status: Completed 05/2020. Results reflect: Bone density results: NORMAL. Repeat every 2 years.  Lung Cancer Screening: (Low Dose CT Chest recommended if Age 59-80 years, 30 pack-year currently smoking OR have quit w/in 15years.) does not qualify.   Lung Cancer Screening Referral: no  Additional Screening:  Hepatitis C Screening: does qualify; Completed yes  Vision Screening: Recommended annual ophthalmology exams for early detection of glaucoma and other disorders of the eye. Is the patient up to date with their annual eye exam?  Yes  Who is the provider or what is the name of the office in which the patient attends annual eye exams? [facility] If pt is not established with a provider, would they like to be referred to a provider to establish care? No .   Dental Screening: Recommended annual dental exams for proper oral hygiene  Community Resource Referral / Chronic Care Management: CRR required this visit?  No   CCM required this visit?  No      Plan:     I  have personally reviewed and noted the following in the patient's chart:   Medical and social history Use of alcohol, tobacco or  illicit drugs  Current medications and supplements Functional ability and status Nutritional status Physical activity Advanced directives List of other physicians Hospitalizations, surgeries, and ER visits in previous 12 months Vitals Screenings to include cognitive, depression, and falls Referrals and appointments  In addition, I have reviewed and discussed with patient certain preventive protocols, quality metrics, and best practice recommendations. A written personalized care plan for preventive services as well as general preventive health recommendations were provided to patient.     Mickeal Needy, LPN   [id number]   Nurse Notes: n/a   Medical screening examination/treatment/procedure(s) were performed by non-physician practitioner and as supervising physician I was immediately available for consultation/collaboration.  I agree with above. Jacinta Shoe, MD

## 2020-09-17 DIAGNOSIS — Z23 Encounter for immunization: Secondary | ICD-10-CM | POA: Diagnosis not present

## 2020-11-25 DIAGNOSIS — H2513 Age-related nuclear cataract, bilateral: Secondary | ICD-10-CM | POA: Diagnosis not present

## 2020-11-28 ENCOUNTER — Other Ambulatory Visit: Payer: Self-pay | Admitting: Internal Medicine

## 2020-12-05 ENCOUNTER — Ambulatory Visit: Payer: Medicare Other | Admitting: Internal Medicine

## 2020-12-08 ENCOUNTER — Other Ambulatory Visit: Payer: Self-pay

## 2020-12-09 ENCOUNTER — Ambulatory Visit (INDEPENDENT_AMBULATORY_CARE_PROVIDER_SITE_OTHER): Payer: Medicare HMO | Admitting: Internal Medicine

## 2020-12-09 ENCOUNTER — Other Ambulatory Visit: Payer: Self-pay

## 2020-12-09 ENCOUNTER — Encounter: Payer: Self-pay | Admitting: Internal Medicine

## 2020-12-09 VITALS — BP 142/88 | HR 75 | Temp 98.1°F | Wt 151.2 lb

## 2020-12-09 DIAGNOSIS — L509 Urticaria, unspecified: Secondary | ICD-10-CM

## 2020-12-09 DIAGNOSIS — I10 Essential (primary) hypertension: Secondary | ICD-10-CM | POA: Diagnosis not present

## 2020-12-09 DIAGNOSIS — R7989 Other specified abnormal findings of blood chemistry: Secondary | ICD-10-CM

## 2020-12-09 DIAGNOSIS — Z0000 Encounter for general adult medical examination without abnormal findings: Secondary | ICD-10-CM

## 2020-12-09 DIAGNOSIS — K7400 Hepatic fibrosis, unspecified: Secondary | ICD-10-CM

## 2020-12-09 NOTE — Assessment & Plan Note (Signed)
No relapse 

## 2020-12-09 NOTE — Assessment & Plan Note (Signed)
Monitor labs - TSH
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
## 2020-12-09 NOTE — Assessment & Plan Note (Signed)
Check LFTs 

## 2020-12-09 NOTE — Assessment & Plan Note (Addendum)
Cont w/Micardis and Norvasc SBP 130 at home

## 2021-01-13 ENCOUNTER — Telehealth: Payer: Self-pay | Admitting: Internal Medicine

## 2021-01-13 NOTE — Telephone Encounter (Signed)
Patient called and was wondering if something could be called in for having dry skin around her mouth and nose. She said that something was called in before but does not know the name of it. She can it can be sent to [id number] - [place], [place] - [address]

## 2021-01-17 NOTE — Telephone Encounter (Signed)
She can try over-the-counter hydrocortisone cream 1% (Cortaid) twice a day as needed.  Thanks

## 2021-01-17 NOTE — Telephone Encounter (Signed)
Notified pt w/MD response.../lmb 

## 2021-02-21 ENCOUNTER — Other Ambulatory Visit: Payer: Self-pay | Admitting: Internal Medicine

## 2021-02-22 ENCOUNTER — Encounter: Payer: Self-pay | Admitting: Internal Medicine

## 2021-02-22 ENCOUNTER — Ambulatory Visit (INDEPENDENT_AMBULATORY_CARE_PROVIDER_SITE_OTHER): Payer: Medicare HMO | Admitting: Internal Medicine

## 2021-02-22 ENCOUNTER — Other Ambulatory Visit: Payer: Self-pay

## 2021-02-22 DIAGNOSIS — M25551 Pain in right hip: Secondary | ICD-10-CM

## 2021-02-22 MED ORDER — METHYLPREDNISOLONE 4 MG PO TBPK: ORAL_TABLET | ORAL | 0 refills | Status: DC

## 2021-02-22 MED ORDER — LIDOCAINE-EPINEPHRINE 2 %-1:100000 IJ SOLN
3.0000 mL | Freq: Once | INTRAMUSCULAR | Status: AC
  Administered 2021-02-22: 3 mL

## 2021-02-22 MED ORDER — METHYLPREDNISOLONE ACETATE 80 MG/ML IJ SUSP
80.0000 mg | Freq: Once | INTRAMUSCULAR | Status: AC
  Administered 2021-02-22: 80 mg via INTRAMUSCULAR

## 2021-02-22 NOTE — Assessment & Plan Note (Signed)
R troch bursitis Pt asked to inject - see procedure Ice, heat, massage Medrol pack Hip opener exercises

## 2021-02-22 NOTE — Addendum Note (Signed)
Addended by: Deatra James on: 02/22/2021 09:30 AM   Modules accepted: Orders

## 2021-02-22 NOTE — Patient Instructions (Addendum)
Postprocedure instructions :    A Band-Aid should be left on for 12 hours. Injection therapy is not a cure itself. It is used in conjunction with other modalities. You can use nonsteroidal anti-inflammatories like ibuprofen , hot and cold compresses. Rest is recommended in the next 24 hours. You need to report immediately  if fever, chills or any signs of infection develop.  Ice, heat, massage Hip opener exercises   Hip Bursitis Rehab Ask your health care provider which exercises are safe for you. Do exercises exactly as told by your health care provider and adjust them as directed. It is normal to feel mild stretching, pulling, tightness, or discomfort as you do these exercises. Stop right away if you feel sudden pain or your pain gets worse. Do not begin these exercises until told by your health care provider. Stretching exercise This exercise warms up your muscles and joints and improves the movement and flexibility of your hip. This exercise also helps to relieve pain and stiffness. Iliotibial band stretch An iliotibial band is a strong band of muscle tissue that runs from the outer side of your hip to the outer side of your thigh and knee. 1. Lie on your side with your left / right leg in the top position. 2. Bend your left / right knee and grab your ankle. Stretch out your bottom arm to help you balance. 3. Slowly bring your knee back so your thigh is behind your body. 4. Slowly lower your knee toward the floor until you feel a gentle stretch on the outside of your left / right thigh. If you do not feel a stretch and your knee will not fall farther, place the heel of your other foot on top of your knee and pull your knee down toward the floor with your foot. 5. Hold this position for __________ seconds. 6. Slowly return to the starting position. Repeat __________ times. Complete this exercise __________ times a day.   Strengthening exercises These exercises build strength and endurance  in your hip and pelvis. Endurance is the ability to use your muscles for a long time, even after they get tired. Bridge This exercise strengthens the muscles that move your thigh backward (hip extensors). 1. Lie on your back on a firm surface with your knees bent and your feet flat on the floor. 2. Tighten your buttocks muscles and lift your buttocks off the floor until your trunk is level with your thighs. ? Do not arch your back. ? You should feel the muscles working in your buttocks and the back of your thighs. If you do not feel these muscles, slide your feet 1-2 inches (2.5-5 cm) farther away from your buttocks. ? If this exercise is too easy, try doing it with your arms crossed over your chest. 3. Hold this position for __________ seconds. 4. Slowly lower your hips to the starting position. 5. Let your muscles relax completely after each repetition. Repeat __________ times. Complete this exercise __________ times a day.   Squats This exercise strengthens the muscles in front of your thigh and knee (quadriceps). 1. Stand in front of a table, with your feet and knees pointing straight ahead. You may rest your hands on the table for balance but not for support. 2. Slowly bend your knees and lower your hips like you are going to sit in a chair. ? Keep your weight over your heels, not over your toes. ? Keep your lower legs upright so they are parallel with the  table legs. ? Do not let your hips go lower than your knees. ? Do not bend lower than told by your health care provider. ? If your hip pain increases, do not bend as low. 3. Hold the squat position for __________ seconds. 4. Slowly push with your legs to return to standing. Do not use your hands to pull yourself to standing. Repeat __________ times. Complete this exercise __________ times a day. Hip hike 1. Stand sideways on a bottom step. Stand on your left / right leg with your other foot unsupported next to the step. You can hold  on to the railing or wall for balance if needed. 2. Keep your knees straight and your torso square. Then lift your left / right hip up toward the ceiling. 3. Hold this position for __________ seconds. 4. Slowly let your left / right hip lower toward the floor, past the starting position. Your foot should get closer to the floor. Do not lean or bend your knees. Repeat __________ times. Complete this exercise __________ times a day. Single leg stand 1. Without shoes, stand near a railing or in a doorway. You may hold on to the railing or door frame as needed for balance. 2. Squeeze your left / right buttock muscles, then lift up your other foot. ? Do not let your left / right hip push out to the side. ? It is helpful to stand in front of a mirror for this exercise so you can watch your hip. 3. Hold this position for __________ seconds. Repeat __________ times. Complete this exercise __________ times a day. This information is not intended to replace advice given to you by your health care provider. Make sure you discuss any questions you have with your health care provider. Document Revised: 03/09/2019 Document Reviewed: 03/09/2019 Elsevier Patient Education  2021 [employer]  Hip Bursitis  Hip bursitis is swelling of one or more fluid-filled sacs (bursae) in your hip joint. This condition can cause pain, and your symptoms may come and go over time. What are the causes?  Repeated use of your hip muscles.  Injury to the hip.  Weak butt muscles.  Bone spurs.  Infection. In some cases, the cause may not be known. What increases the risk? You are more likely to develop this condition if:  You had a past hip injury or hip surgery.  You have a condition, such as arthritis, gout, diabetes, or thyroid disease.  You have spine problems.  You have one leg that is shorter than the other.  You run a lot or do long-distance running.  You play sports where there is a risk of injury or  falling, such as football, martial arts, or skiing. What are the signs or symptoms? Symptoms may come and go, and they often include:  Pain in the hip or groin area. Pain may get worse when you move your hip.  Tenderness and swelling of the hip. In rare cases, the bursa may become infected. If this happens, you may get a fever, as well as have warmth and redness in the hip area. How is this treated? This condition is treated by:  Resting your hip.  Icing your hip.  Wrapping the hip area with an elastic bandage (compression wrap).  Keeping the hip raised. Other treatments may include medicine, draining fluid out of the bursa, or using crutches, a cane, or a walker. Surgery may be needed, but this is rare. Long-term treatment may include doing exercises to help your  strength and flexibility. It may also include lifestyle changes like losing weight to lessen the strain on your hip. Follow these instructions at home: Managing pain, stiffness, and swelling  If told, put ice on the painful area. ? Put ice in a plastic bag. ? Place a towel between your skin and the bag. ? Leave the ice on for 20 minutes, 2-3 times a day.  Raise your hip by putting a pillow under your hips while you lie down. Stop if you feel pain.  If told, put heat on the affected area. Do this as often as told by your doctor. Use a moist heat pack or a heating pad as told by your doctor. ? Place a towel between your skin and the heat source. ? Leave the heat on for 20-30 minutes. ? Take off the heat if your skin turns bright red. This is very important if you are unable to feel pain, heat, or cold. You may have a greater risk of getting burned.      Activity  Do not use your hip to support your body weight until your doctor says that you can.  Use crutches, a cane, or a walker as told by your doctor.  If the affected leg is one that you use to drive, ask your doctor if it is safe to drive.  Rest and protect  your hip as much as you can until you feel better.  Return to your normal activities as told by your doctor. Ask your doctor what activities are safe for you.  Do exercises as told by your doctor. General instructions  Take over-the-counter and prescription medicines only as told by your doctor.  Gently rub and stretch your injured area as often as is comfortable.  Wear elastic bandages only as told by your doctor.  If one of your legs is shorter than the other, get fitted for a shoe insert or orthotic.  Keep a healthy weight. Follow instructions from your doctor.  Keep all follow-up visits as told by your doctor. This is important. How is this prevented?  Exercise regularly, as told by your doctor.  Wear the right shoes for the sport you play.  Warm up and stretch before being active. Cool down and stretch after being active.  Take breaks often from repeated activity.  Avoid activities that bother your hip or cause pain.  Avoid sitting down for a long time. Where to find more information  American Academy [employer]: orthoinfo.aaos.org Contact a doctor if:  You have a fever.  You have new symptoms.  You have trouble walking or doing everyday activities.  You have pain that gets worse or does not get better with medicine.  Your skin around your hip is red.  You get a feeling of warmth in your hip area. Get help right away if:  You cannot move your hip.  You have very bad pain.  You cannot control the muscles in your feet. Summary  Hip bursitis is swelling of one or more fluid-filled sacs (bursae) in your hip joint.  Symptoms often come and go over time.  This condition is often treated by resting and icing the hip. It also may help to keep the area raised and wrapped in an elastic bandage. Other treatments may be needed. This information is not intended to replace advice given to you by your health care provider. Make sure you discuss any  questions you have with your health care provider. Document Revised: 09/14/2019 Document  Reviewed: 07/21/2018 Elsevier Patient Education  2021 [employer].
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
## 2021-02-28 ENCOUNTER — Ambulatory Visit: Payer: Medicare HMO | Admitting: Internal Medicine

## 2021-03-20 ENCOUNTER — Other Ambulatory Visit: Payer: Self-pay | Admitting: Internal Medicine

## 2021-04-09 ENCOUNTER — Other Ambulatory Visit: Payer: Self-pay | Admitting: Internal Medicine

## 2021-04-27 ENCOUNTER — Other Ambulatory Visit: Payer: Self-pay | Admitting: Internal Medicine

## 2021-05-16 ENCOUNTER — Encounter: Payer: Self-pay | Admitting: Internal Medicine

## 2021-05-16 ENCOUNTER — Other Ambulatory Visit: Payer: Self-pay

## 2021-05-16 ENCOUNTER — Ambulatory Visit (INDEPENDENT_AMBULATORY_CARE_PROVIDER_SITE_OTHER): Payer: Medicare HMO | Admitting: Internal Medicine

## 2021-05-16 DIAGNOSIS — M25532 Pain in left wrist: Secondary | ICD-10-CM | POA: Diagnosis not present

## 2021-05-16 MED ORDER — METHYLPREDNISOLONE 4 MG PO TBPK: ORAL_TABLET | ORAL | 0 refills | Status: DC
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
## 2021-05-16 NOTE — Assessment & Plan Note (Addendum)
New. DeQuervain's ACE wrap Medrol pack Voltaren gel Rest

## 2021-05-16 NOTE — Patient Instructions (Addendum)
DeQuervain's tendonitis  Voltaren gel  Ice/heat

## 2021-05-18 ENCOUNTER — Other Ambulatory Visit: Payer: Self-pay | Admitting: Internal Medicine

## 2021-05-22 ENCOUNTER — Other Ambulatory Visit: Payer: Self-pay | Admitting: Internal Medicine

## 2021-05-23 ENCOUNTER — Ambulatory Visit (INDEPENDENT_AMBULATORY_CARE_PROVIDER_SITE_OTHER): Payer: Medicare HMO | Admitting: Internal Medicine

## 2021-05-23 ENCOUNTER — Encounter: Payer: Self-pay | Admitting: Internal Medicine

## 2021-05-23 ENCOUNTER — Other Ambulatory Visit: Payer: Self-pay

## 2021-05-23 DIAGNOSIS — M25532 Pain in left wrist: Secondary | ICD-10-CM

## 2021-05-23 DIAGNOSIS — L0211 Cutaneous abscess of neck: Secondary | ICD-10-CM

## 2021-05-23 MED ORDER — MUPIROCIN 2 % EX OINT: TOPICAL_OINTMENT | CUTANEOUS | 0 refills | Status: DC

## 2021-05-23 MED ORDER — DOXYCYCLINE HYCLATE 100 MG PO TABS: 100.0000 mg | ORAL_TABLET | Freq: Two times a day (BID) | ORAL | 0 refills | Status: DC

## 2021-05-23 NOTE — Patient Instructions (Signed)
   Wound instructions provided.   Wound instructions : change dressing once a day or twice a day is needed. Change dressing after  shower in the morning.  Pat dry the wound with gauze. Pull out one inch of packing everyday and cut it off. Re-dress wound with antibiotic ointment and Telfa pad.of appropriate size.   Please contact us if you notice a recollection of pus in the abscess fever and chills increased pain redness red streaks near the abscess increased swelling in the area.

## 2021-05-23 NOTE — Assessment & Plan Note (Signed)
Cont Rx RTC for injection if needed ACE wrap
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
## 2021-05-23 NOTE — Assessment & Plan Note (Addendum)
A large boil on post neck 3x3 cm - infected cyst See I&D Doxy x 10 d Bactroban Rx

## 2021-05-24 ENCOUNTER — Other Ambulatory Visit: Payer: Self-pay | Admitting: Internal Medicine

## 2021-05-24 NOTE — Telephone Encounter (Signed)
Please refill as per office routine med refill policy (all routine meds refilled for 3 mo or monthly per pt preference up to one year from last visit, then month to month grace period for 3 mo, then further med refills will have to be denied)  

## 2021-05-25 ENCOUNTER — Other Ambulatory Visit: Payer: Self-pay | Admitting: Internal Medicine

## 2021-06-01 ENCOUNTER — Telehealth: Payer: Self-pay | Admitting: Internal Medicine

## 2021-06-01 NOTE — Telephone Encounter (Signed)
Type of form received: Intention of vacate   Additional comments: due by 06/30/2021  Received by: [place]   Form should be Faxed to: N/A  Form should be mailed to:  N/A  Is patient requesting call for pickup: Y   Form placed in the Provider's box.  *Attach charge sheet.  Provider will determine charge.*  Was patient informed of  7-10 business day turn around (Y/N)? Jeannie Fend

## 2021-06-01 NOTE — Telephone Encounter (Signed)
Pt is needing a Dr. Arlina Robes note stating she has problems with her left hand/shoulder.She live on the 3rd floor and she states she is not able to lift/carry anything upstairs due to her hand pain/ arthritis.  She states she is planning on leaving property early, but due to lease she is needing documentation that she has the problem with her hand. Is this ok to write.Marland KitchenRaechel Chute

## 2021-06-02 NOTE — Telephone Encounter (Signed)
Lucy, Please write Ms. Granada a note or a letter. Thanks,

## 2021-06-02 NOTE — Telephone Encounter (Signed)
Notified pt letter ready for pick-up..lmb 

## 2021-06-13 DIAGNOSIS — Z6828 Body mass index (BMI) 28.0-28.9, adult: Secondary | ICD-10-CM | POA: Diagnosis not present

## 2021-06-13 DIAGNOSIS — Z124 Encounter for screening for malignant neoplasm of cervix: Secondary | ICD-10-CM | POA: Diagnosis not present

## 2021-06-13 DIAGNOSIS — Z1231 Encounter for screening mammogram for malignant neoplasm of breast: Secondary | ICD-10-CM | POA: Diagnosis not present

## 2021-06-19 ENCOUNTER — Telehealth: Payer: Self-pay

## 2021-06-20 NOTE — Telephone Encounter (Signed)
error 

## 2021-08-10 ENCOUNTER — Ambulatory Visit (INDEPENDENT_AMBULATORY_CARE_PROVIDER_SITE_OTHER): Payer: Medicare HMO | Admitting: Internal Medicine

## 2021-08-10 ENCOUNTER — Encounter: Payer: Self-pay | Admitting: Internal Medicine

## 2021-08-10 ENCOUNTER — Other Ambulatory Visit: Payer: Self-pay

## 2021-08-10 VITALS — BP 138/90 | HR 80 | Temp 97.8°F | Ht 63.0 in | Wt 150.2 lb

## 2021-08-10 DIAGNOSIS — Z8616 Personal history of COVID-19: Secondary | ICD-10-CM | POA: Diagnosis not present

## 2021-08-10 DIAGNOSIS — U071 COVID-19: Secondary | ICD-10-CM

## 2021-08-10 DIAGNOSIS — Z0000 Encounter for general adult medical examination without abnormal findings: Secondary | ICD-10-CM | POA: Diagnosis not present

## 2021-08-10 DIAGNOSIS — R7989 Other specified abnormal findings of blood chemistry: Secondary | ICD-10-CM

## 2021-08-10 DIAGNOSIS — Z23 Encounter for immunization: Secondary | ICD-10-CM

## 2021-08-10 LAB — URINALYSIS
Bilirubin Urine: NEGATIVE
Hgb urine dipstick: NEGATIVE
Ketones, ur: NEGATIVE
Leukocytes,Ua: NEGATIVE
Nitrite: NEGATIVE
Specific Gravity, Urine: <=1.005 — AB (ref 1.000–1.030)
Total Protein, Urine: NEGATIVE
Urine Glucose: NEGATIVE
Urobilinogen, UA: 0.2 (ref 0.0–1.0)
pH: 6.5 (ref 5.0–8.0)

## 2021-08-10 LAB — CBC WITH DIFFERENTIAL/PLATELET
Basophils Absolute: 0 10*3/uL (ref 0.0–0.1)
Basophils Relative: 0.6 % (ref 0.0–3.0)
Eosinophils Absolute: 0.2 10*3/uL (ref 0.0–0.7)
Eosinophils Relative: 4.7 % (ref 0.0–5.0)
HCT: 39.8 % (ref 36.0–46.0)
Hemoglobin: 12.9 g/dL (ref 12.0–15.0)
Lymphocytes Relative: 38.5 % (ref 12.0–46.0)
Lymphs Abs: 2 10*3/uL (ref 0.7–4.0)
MCHC: 32.3 g/dL (ref 30.0–36.0)
MCV: 81.4 fl (ref 78.0–100.0)
Monocytes Absolute: 0.3 10*3/uL (ref 0.1–1.0)
Monocytes Relative: 6.2 % (ref 3.0–12.0)
Neutro Abs: 2.6 10*3/uL (ref 1.4–7.7)
Neutrophils Relative %: 50 % (ref 43.0–77.0)
Platelets: 304 10*3/uL (ref 150.0–400.0)
RBC: 4.89 Mil/uL (ref 3.87–5.11)
RDW: 14.5 % (ref 11.5–15.5)
WBC: 5.2 10*3/uL (ref 4.0–10.5)

## 2021-08-10 LAB — COMPREHENSIVE METABOLIC PANEL
ALT: 18 U/L (ref 0–35)
AST: 22 U/L (ref 0–37)
Albumin: 4.4 g/dL (ref 3.5–5.2)
Alkaline Phosphatase: 52 U/L (ref 39–117)
BUN: 6 mg/dL (ref 6–23)
CO2: 30 mEq/L (ref 19–32)
Calcium: 10.2 mg/dL (ref 8.4–10.5)
Chloride: 102 mEq/L (ref 96–112)
Creatinine, Ser: 0.7 mg/dL (ref 0.40–1.20)
GFR: 88.41 mL/min (ref >60.00)
Glucose, Bld: 100 mg/dL — ABNORMAL HIGH (ref 70–99)
Potassium: 3.9 mEq/L (ref 3.5–5.1)
Sodium: 139 mEq/L (ref 135–145)
Total Bilirubin: 0.5 mg/dL (ref 0.2–1.2)
Total Protein: 7.8 g/dL (ref 6.0–8.3)

## 2021-08-10 LAB — LIPID PANEL
Cholesterol: 219 mg/dL — ABNORMAL HIGH (ref 0–200)
HDL: 41.3 mg/dL (ref >39.00)
LDL Cholesterol: 141 mg/dL — ABNORMAL HIGH (ref 0–99)
NonHDL: 177.41
Total CHOL/HDL Ratio: 5
Triglycerides: 181 mg/dL — ABNORMAL HIGH (ref 0.0–149.0)
VLDL: 36.2 mg/dL (ref 0.0–40.0)

## 2021-08-10 LAB — TSH: TSH: 0.36 u[IU]/mL (ref 0.35–5.50)

## 2021-08-10 LAB — T4, FREE: Free T4: 0.75 ng/dL (ref 0.60–1.60)

## 2021-08-10 MED ORDER — TELMISARTAN 40 MG PO TABS: 40.0000 mg | ORAL_TABLET | Freq: Every day | ORAL | 3 refills | Status: DC

## 2021-08-10 MED ORDER — AMLODIPINE BESYLATE 5 MG PO TABS: 5.0000 mg | ORAL_TABLET | Freq: Every day | ORAL | 3 refills | Status: DC

## 2021-08-10 MED ORDER — TRIAMCINOLONE ACETONIDE 0.5 % EX OINT: 1.0000 "application " | TOPICAL_OINTMENT | Freq: Two times a day (BID) | CUTANEOUS | 1 refills | Status: AC

## 2021-08-10 MED ORDER — PROMETHAZINE-CODEINE 6.25-10 MG/5ML PO SYRP: 5.0000 mL | ORAL_SOLUTION | ORAL | 0 refills | Status: DC | PRN

## 2021-08-10 NOTE — Assessment & Plan Note (Signed)
  We discussed age appropriate health related issues, including available/recomended screening tests and vaccinations. Labs were ordered to be later reviewed . All questions were answered. We discussed one or more of the following - seat belt use, use of sunscreen/sun exposure exercise, fall risk reduction, second hand smoke exposure, firearm use and storage, seat belt use, a need for adhering to healthy diet and exercise. Labs were ordered.  All questions were answered. Due PAP, mammo Ophth exam just had it Colon due in 2027 Dr Russella Dar Flu shot
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
## 2021-08-10 NOTE — Assessment & Plan Note (Signed)
COVID on 07/26/21 w/ residual dry cough. Given Prom-cod syr

## 2021-08-10 NOTE — Addendum Note (Signed)
Addended by: Miguel Aschoff on: 08/10/2021 12:03 PM   Modules accepted: Orders

## 2021-08-12 ENCOUNTER — Telehealth: Payer: Self-pay | Admitting: Internal Medicine

## 2021-08-14 NOTE — Telephone Encounter (Signed)
Also recieved fax stating [facility] no longer dispense Promethazine-Codeine sol will need alternative sent in.Marland KitchenRaechel Chute

## 2021-08-16 NOTE — Telephone Encounter (Signed)
Patient calling back in to see if provider has had a chance to submit alternative medicine to pharmacy (see previous message)

## 2021-08-17 MED ORDER — HYDROCODONE BIT-HOMATROP MBR 5-1.5 MG/5ML PO SOLN: 5.0000 mL | Freq: Three times a day (TID) | ORAL | 0 refills | Status: DC | PRN

## 2021-08-17 NOTE — Addendum Note (Signed)
Addended by: Tresa Garter on: 08/17/2021 07:41 AM   Modules accepted: Orders

## 2021-08-17 NOTE — Telephone Encounter (Signed)
Okay Hycodan.  Thanks 

## 2021-08-21 ENCOUNTER — Telehealth: Payer: Self-pay | Admitting: *Deleted

## 2021-08-21 MED ORDER — PROMETHAZINE-CODEINE 6.25-10 MG/5ML PO SYRP: 5.0000 mL | ORAL_SOLUTION | Freq: Four times a day (QID) | ORAL | 0 refills | Status: DC | PRN

## 2021-08-21 NOTE — Telephone Encounter (Signed)
Ok for change to phenergan with codeine prn - done erx

## 2021-08-21 NOTE — Telephone Encounter (Signed)
Rec'd fax stating the Hycodan & Hydromet cough syrup are both on back order w/ no release date. Requesting rx to be change to Guaifenesin/Codeine or Promethazine/ DM. MD is out of the office until tomorrow pls advise.Marland KitchenRaechel Chute

## 2021-08-29 ENCOUNTER — Telehealth: Payer: Self-pay | Admitting: Internal Medicine

## 2021-08-29 ENCOUNTER — Other Ambulatory Visit: Payer: Self-pay | Admitting: Internal Medicine

## 2021-08-29 NOTE — Telephone Encounter (Signed)
Please send new rx for HYDROcodone bit-homatropine (HYCODAN) 5-1.5 MG/5ML syrup to [facility]  [facility] no longer stocks this rx  Pharmacy:  [id number] - [place], [place] - [address] AT [facility] [address]  Phone:  [phone] Fax:  [phone]

## 2021-09-01 MED ORDER — PROMETHAZINE-CODEINE 6.25-10 MG/5ML PO SYRP: 5.0000 mL | ORAL_SOLUTION | Freq: Four times a day (QID) | ORAL | 0 refills | Status: DC | PRN

## 2021-09-01 NOTE — Telephone Encounter (Signed)
OK. Thx

## 2021-11-07 ENCOUNTER — Ambulatory Visit (INDEPENDENT_AMBULATORY_CARE_PROVIDER_SITE_OTHER): Payer: Medicare HMO

## 2021-11-07 ENCOUNTER — Other Ambulatory Visit: Payer: Self-pay

## 2021-11-07 DIAGNOSIS — Z0000 Encounter for general adult medical examination without abnormal findings: Secondary | ICD-10-CM

## 2021-11-07 NOTE — Progress Notes (Addendum)
I connected with Dana Long today by telephone and verified that I am speaking with the correct person using two identifiers. Location patient: home Location provider: work Persons participating in the virtual visit: patient, provider.   I discussed the limitations, risks, security and privacy concerns of performing an evaluation and management service by telephone and the availability of in person appointments. I also discussed with the patient that there may be a patient responsible charge related to this service. The patient expressed understanding and verbally consented to this telephonic visit.    Interactive audio and video telecommunications were attempted between this provider and patient, however failed, due to patient having technical difficulties OR patient did not have access to video capability.  We continued and completed visit with audio only.  Some vital signs may be absent or patient reported.   Time Spent with patient on telephone encounter: 40 minutes   Subjective:   Dana Long is a 70 y.o. female who presents for Medicare Annual (Subsequent) preventive examination.  Review of Systems     Cardiac Risk Factors include: advanced age (>53men, >78 women);family history of premature cardiovascular disease;hypertension     Objective:    There were no vitals filed for this visit. There is no height or weight on file to calculate BMI.  Advanced Directives 11/07/2021 09/14/2020 07/15/2019 08/31/2018 02/21/2016  Does Patient Have a Medical Advance Directive? No No No No No  Would patient like information on creating a medical advance directive? No - Patient declined Yes (MAU/Ambulatory/Procedural Areas - Information given) No - Patient declined - No - patient declined information    Current Medications (verified) Outpatient Encounter Medications as of 11/07/2021  Medication Sig   amLODipine (NORVASC) 5 MG tablet Take 1 tablet (5 mg total) by mouth daily.    Calcium-Phosphorus-Vitamin D (CITRACAL +D3 PO) Take 1 each by mouth daily. Reported on 02/21/2016   fluticasone (FLONASE) 50 MCG/ACT nasal spray USE 2 SPRAYS INTO EACH NOSTRIL EVERY DAY   ibuprofen (ADVIL) 600 MG tablet TAKE 1 TABLET TWICE A DAY FOR 1 WEEK THEN AS NEEDED FOR PAIN   ipratropium (ATROVENT) 0.06 % nasal spray USE 2 SPRAYS IN EACH NOSTRIL 3 TIMES A DAY   mupirocin ointment (BACTROBAN) 2 % On leg wound w/dressing change qd or bid   telmisartan (MICARDIS) 40 MG tablet Take 1 tablet (40 mg total) by mouth daily.   triamcinolone ointment (KENALOG) 0.5 % Apply 1 application topically 2 (two) times daily.   promethazine-codeine (PHENERGAN WITH CODEINE) 6.25-10 MG/5ML syrup Take 5 mLs by mouth every 6 (six) hours as needed for cough. (Patient not taking: Reported on 11/07/2021)   No facility-administered encounter medications on file as of 11/07/2021.    Allergies (verified) Maxzide [hydrochlorothiazide w-triamterene]   History: Past Medical History:  Diagnosis Date   GERD (gastroesophageal reflux disease)    Gout    ?   Hypertension    Past Surgical History:  Procedure Laterality Date   TUBAL LIGATION     Family History  Problem Relation Age of Onset   Gout Father    Diabetes Mother    Diabetes Sister    Diabetes Brother    Diabetes Other    Hypertension Other    Colon cancer Neg Hx    Social History   Socioeconomic History   Marital status: Single    Spouse name: Not on file   Number of children: Not on file   Years of education: Not on file   Highest education  level: Not on file  Occupational History   Not on file  Tobacco Use   Smoking status: Former   Smokeless tobacco: Never  Substance and Sexual Activity   Alcohol use: No    Alcohol/week: 0.0 standard drinks   Drug use: No   Sexual activity: Yes  Other Topics Concern   Not on file  Social History Narrative   Not on file   Social Determinants of Health   Financial Resource Strain: Low Risk     Difficulty of Paying Living Expenses: Not hard at all  Food Insecurity: No Food Insecurity   Worried About Programme researcher, broadcasting/film/video in the Last Year: Never true   Ran Out of Food in the Last Year: Never true  Transportation Needs: No Transportation Needs   Lack of Transportation (Medical): No   Lack of Transportation (Non-Medical): No  Physical Activity: Sufficiently Active   Days of Exercise per Week: 5 days   Minutes of Exercise per Session: 30 min  Stress: No Stress Concern Present   Feeling of Stress : Not at all  Social Connections: Moderately Integrated   Frequency of Communication with Friends and Family: More than three times a week   Frequency of Social Gatherings with Friends and Family: More than three times a week   Attends Religious Services: More than 4 times per year   Active Member of [employer] or Organizations: Yes   Attends Banker Meetings: More than 4 times per year   Marital Status: Widowed    Tobacco Counseling Counseling given: Not Answered   Clinical Intake:  Pre-visit preparation completed: Yes  Pain : No/denies pain     Diabetes: No  How often do you need to have someone help you when you read instructions, pamphlets, or other written materials from your doctor or pharmacy?: 1 - Never What is the last grade level you completed in school?: High School Graduate  Diabetic? no  Interpreter Needed?: No  Information entered by :: Susie Cassette, LPN   Activities of Daily Living In your present state of health, do you have any difficulty performing the following activities: 11/07/2021 11/07/2021  Hearing? N N  Vision? N N  Difficulty concentrating or making decisions? N N  Walking or climbing stairs? N N  Dressing or bathing? N N  Doing errands, shopping? N N  Preparing Food and eating ? N N  Using the Toilet? N N  In the past six months, have you accidently leaked urine? N N  Do you have problems with loss of bowel control? N N   Managing your Medications? N N  Managing your Finances? N N  Housekeeping or managing your Housekeeping? N N  Some recent data might be hidden    Patient Care Team: Plotnikov, Georgina Quint, MD as PCP - General (Internal Medicine) Gennie Alma, CNM (Inactive) as Midwife (Obstetrics and Gynecology)  Indicate any recent Medical Services you may have received from other than [facility] providers in the past year (date may be approximate).     Assessment:   This is a routine wellness examination for Dana Long.  Hearing/Vision screen Hearing Screening - Comments:: Patient denied any hearing difficulty.   No hearing aids.  Vision Screening - Comments:: Patient wears corrective glasses/contacts.  Eye exam done annually by: [facility]  Dietary issues and exercise activities discussed: Current Exercise Habits: Structured exercise class (Silver sneakers), Type of exercise: strength training/weights;stretching;treadmill;walking;Other - see comments (balancing, flexability, endurance, stretch bands), Time (Minutes):  30, Frequency (Times/Week): 5, Weekly Exercise (Minutes/Week): 150, Intensity: Moderate, Exercise limited by: None identified   Goals Addressed               This Visit's Progress     Patient Stated (pt-stated)        "Catch me if you can"  that's my motto.  I will continue to stay physically, socially and mentally active.      Depression Screen PHQ 2/9 Scores 11/07/2021 08/10/2021 09/14/2020 05/27/2019 12/24/2017 04/08/2017 03/20/2016  PHQ - 2 Score 0 0 0 0 0 0 0  PHQ- 9 Score - 0 - - - - -    Fall Risk Fall Risk  11/07/2021 08/10/2021 09/14/2020 05/27/2019 12/24/2017  Falls in the past year? 0 0 0 0 No  Number falls in past yr: 0 0 0 - -  Injury with Fall? 0 0 0 - -  Risk for fall due to : No Fall Risks No Fall Risks No Fall Risks - -  Follow up Falls prevention discussed - Falls evaluation completed Falls evaluation completed -    FALL RISK PREVENTION  PERTAINING TO THE HOME:  Any stairs in or around the home? No  If so, are there any without handrails? No  Home free of loose throw rugs in walkways, pet beds, electrical cords, etc? Yes  Adequate lighting in your home to reduce risk of falls? Yes   ASSISTIVE DEVICES UTILIZED TO PREVENT FALLS:  Life alert? No  Use of a cane, walker or w/c? No  Grab bars in the bathroom? Yes  Shower chair or bench in shower? No  Elevated toilet seat or a handicapped toilet? No   TIMED UP AND GO:  Was the test performed? No .  Length of time to ambulate 10 feet: n/a sec.   Gait steady and fast without use of assistive device  Cognitive Function: Normal cognitive status assessed by direct observation by this Nurse Health Advisor. No abnormalities found.       6CIT Screen 09/14/2020  What Year? 0 points  What month? 0 points  What time? 0 points  Count back from 20 0 points  Months in reverse 0 points  Repeat phrase 0 points  Total Score 0    Immunizations Immunization History  Administered Date(s) Administered   Fluad Quad(high Dose 65+) 07/29/2019, 08/10/2021   Hepatitis B, adult 04/04/2015, 05/02/2015, 03/20/2016   Influenza Split 09/06/2016, 07/24/2017   Influenza,inj,Quad PF,6+ Mos 11/11/2013, 11/16/2014, 08/26/2015   Influenza-Unspecified 07/17/2018   PFIZER(Purple Top)SARS-COV-2 Vaccination 12/18/2019, 01/08/2020, 09/17/2020   Pneumococcal Conjugate-13 07/11/2018   Tdap 11/14/2012, 12/22/2017   Zoster, Live 06/16/2013    TDAP status: Up to date  Flu Vaccine status: Up to date  Pneumococcal vaccine status: Due, Education has been provided regarding the importance of this vaccine. Advised may receive this vaccine at local pharmacy or Health Dept. Aware to provide a copy of the vaccination record if obtained from local pharmacy or Health Dept. Verbalized acceptance and understanding.  Covid-19 vaccine status: Completed vaccines  Qualifies for Shingles Vaccine? Yes   Zostavax  completed Yes   Shingrix Completed?: No.    Education has been provided regarding the importance of this vaccine. Patient has been advised to call insurance company to determine out of pocket expense if they have not yet received this vaccine. Advised may also receive vaccine at local pharmacy or Health Dept. Verbalized acceptance and understanding.  Screening Tests Health Maintenance  Topic Date Due   Zoster Vaccines-  Shingrix (1 of 2) Never done   Pneumonia Vaccine 40+ Years old (2 - PPSV23 if available, else PCV20) 07/12/2019   COVID-19 Vaccine (4 - Booster for Pfizer series) 11/12/2020   MAMMOGRAM  03/26/2021   COLONOSCOPY (Pts 45-57yrs Insurance coverage will need to be confirmed)  03/06/2026   TETANUS/TDAP  12/23/2027   INFLUENZA VACCINE  Completed   DEXA SCAN  Completed   Hepatitis C Screening  Completed   HPV VACCINES  Aged Out    Health Maintenance  Health Maintenance Due  Topic Date Due   Zoster Vaccines- Shingrix (1 of 2) Never done   Pneumonia Vaccine 60+ Years old (2 - PPSV23 if available, else PCV20) 07/12/2019   COVID-19 Vaccine (4 - Booster for Pfizer series) 11/12/2020   MAMMOGRAM  03/26/2021    Colorectal cancer screening: Type of screening: Colonoscopy. Completed 03/06/2016. Repeat every 10 years  Mammogram status: Completed 05/2021. Repeat every year  Bone Density status: Completed 01/11/2016. Results reflect: Bone density results: NORMAL. Repeat every 5 years.  Lung Cancer Screening: (Low Dose CT Chest recommended if Age 72-80 years, 30 pack-year currently smoking OR have quit w/in 15years.) does not qualify.   Lung Cancer Screening Referral: no  Additional Screening:  Hepatitis C Screening: does qualify; Completed yes  Vision Screening: Recommended annual ophthalmology exams for early detection of glaucoma and other disorders of the eye. Is the patient up to date with their annual eye exam?  Yes  Who is the provider or what is the name of the office in  which the patient attends annual eye exams? [facility] If pt is not established with a provider, would they like to be referred to a provider to establish care? No .   Dental Screening: Recommended annual dental exams for proper oral hygiene  Community Resource Referral / Chronic Care Management: CRR required this visit?  No   CCM required this visit?  No      Plan:     I have personally reviewed and noted the following in the patient's chart:   Medical and social history Use of alcohol, tobacco or illicit drugs  Current medications and supplements including opioid prescriptions.  Functional ability and status Nutritional status Physical activity Advanced directives List of other physicians Hospitalizations, surgeries, and ER visits in previous 12 months Vitals Screenings to include cognitive, depression, and falls Referrals and appointments  In addition, I have reviewed and discussed with patient certain preventive protocols, quality metrics, and best practice recommendations. A written personalized care plan for preventive services as well as general preventive health recommendations were provided to patient.     Mickeal Needy, LPN   16/08/[id number]   Nurse Notes:  Patient is cogitatively intact. There were no vitals filed for this visit. There is no height or weight on file to calculate BMI. Patient stated that she has no issues with gait or balance; does not use any assistive devices. Medications reviewed with patient; no opioid use noted. Hearing Screening - Comments:: Patient denied any hearing difficulty.   No hearing aids.  Vision Screening - Comments:: Patient wears corrective glasses/contacts.  [facility] exam done annually by: [facility]   Medical screening examination/treatment/procedure(s) were performed by non-physician practitioner and as supervising physician I was immediately available for consultation/collaboration.  I  agree with above. Jacinta Shoe, MD

## 2021-11-07 NOTE — Patient Instructions (Signed)
Dana Long , Thank you for taking time to come for your Medicare Wellness Visit. I appreciate your ongoing commitment to your health goals. Please review the following plan we discussed and let me know if I can assist you in the future.   Screening recommendations/referrals: Colonoscopy: 03/06/2016; due every 10 years Mammogram: 05/2021; due every year at with Dr. Jonna Munro Density: 01/11/2016; due every 5 years with Dr. Rana Snare Recommended yearly ophthalmology/optometry visit for glaucoma screening and checkup Recommended yearly dental visit for hygiene and checkup  Vaccinations: Influenza vaccine: 08/10/2021 Pneumococcal vaccine: 07/11/2018; need Prevnar20 Tdap vaccine: 12/22/2017; due every 10 years Shingles vaccine: never done; can check with local pharmacy for vaccine.   Covid-19: 12/18/2019, 01/08/2020, 09/17/2020  Advanced directives: Advance directive discussed with you today. I have provided a copy for you to complete at home and have notarized. Once this is complete please bring a copy in to our office so we can scan it into your chart.  Conditions/risks identified:  Catch me if you can" that's my motto. I will continue to stay physically, socially and mentally active.          Next appointment: Telephone Visit scheduled for 11/08/2022 at 8:40 am with Susie Cassette, LPN.   Preventive Care 69 Years and Older, Female Preventive care refers to lifestyle choices and visits with your health care provider that can promote health and wellness. What does preventive care include? A yearly physical exam. This is also called an annual well check. Dental exams once or twice a year. Routine eye exams. Ask your health care provider how often you should have your eyes checked. Personal lifestyle choices, including: Daily care of your teeth and gums. Regular physical activity. Eating a healthy diet. Avoiding tobacco and drug use. Limiting alcohol use. Practicing safe sex. Taking low-dose  aspirin every day. Taking vitamin and mineral supplements as recommended by your health care provider. What happens during an annual well check? The services and screenings done by your health care provider during your annual well check will depend on your age, overall health, lifestyle risk factors, and family history of disease. Counseling  Your health care provider may ask you questions about your: Alcohol use. Tobacco use. Drug use. Emotional well-being. Home and relationship well-being. Sexual activity. Eating habits. History of falls. Memory and ability to understand (cognition). Work and work Astronomer. Reproductive health. Screening  You may have the following tests or measurements: Height, weight, and BMI. Blood pressure. Lipid and cholesterol levels. These may be checked every 5 years, or more frequently if you are over 24 years old. Skin check. Lung cancer screening. You may have this screening every year starting at age 67 if you have a 30-pack-year history of smoking and currently smoke or have quit within the past 15 years. Fecal occult blood test (FOBT) of the stool. You may have this test every year starting at age 76. Flexible sigmoidoscopy or colonoscopy. You may have a sigmoidoscopy every 5 years or a colonoscopy every 10 years starting at age 79. Hepatitis C blood test. Hepatitis B blood test. Sexually transmitted disease (STD) testing. Diabetes screening. This is done by checking your blood sugar (glucose) after you have not eaten for a while (fasting). You may have this done every 1-3 years. Bone density scan. This is done to screen for osteoporosis. You may have this done starting at age 48. Mammogram. This may be done every 1-2 years. Talk to your health care provider about how often you should have regular  mammograms. Talk with your health care provider about your test results, treatment options, and if necessary, the need for more tests. Vaccines  Your  health care provider may recommend certain vaccines, such as: Influenza vaccine. This is recommended every year. Tetanus, diphtheria, and acellular pertussis (Tdap, Td) vaccine. You may need a Td booster every 10 years. Zoster vaccine. You may need this after age 43. Pneumococcal 13-valent conjugate (PCV13) vaccine. One dose is recommended after age 47. Pneumococcal polysaccharide (PPSV23) vaccine. One dose is recommended after age 36. Talk to your health care provider about which screenings and vaccines you need and how often you need them. This information is not intended to replace advice given to you by your health care provider. Make sure you discuss any questions you have with your health care provider. Document Released: 12/09/2015 Document Revised: 08/01/2016 Document Reviewed: 09/13/2015 Elsevier Interactive Patient Education  2017 [employer] Prevention in the Home Falls can cause injuries. They can happen to people of all ages. There are many things you can do to make your home safe and to help prevent falls. What can I do on the outside of my home? Regularly fix the edges of walkways and driveways and fix any cracks. Remove anything that might make you trip as you walk through a door, such as a raised step or threshold. Trim any bushes or trees on the path to your home. Use bright outdoor lighting. Clear any walking paths of anything that might make someone trip, such as rocks or tools. Regularly check to see if handrails are loose or broken. Make sure that both sides of any steps have handrails. Any raised decks and porches should have guardrails on the edges. Have any leaves, snow, or ice cleared regularly. Use sand or salt on walking paths during winter. Clean up any spills in your garage right away. This includes oil or grease spills. What can I do in the bathroom? Use night lights. Install grab bars by the toilet and in the tub and shower. Do not use towel bars as  grab bars. Use non-skid mats or decals in the tub or shower. If you need to sit down in the shower, use a plastic, non-slip stool. Keep the floor dry. Clean up any water that spills on the floor as soon as it happens. Remove soap buildup in the tub or shower regularly. Attach bath mats securely with double-sided non-slip rug tape. Do not have throw rugs and other things on the floor that can make you trip. What can I do in the bedroom? Use night lights. Make sure that you have a light by your bed that is easy to reach. Do not use any sheets or blankets that are too big for your bed. They should not hang down onto the floor. Have a firm chair that has side arms. You can use this for support while you get dressed. Do not have throw rugs and other things on the floor that can make you trip. What can I do in the kitchen? Clean up any spills right away. Avoid walking on wet floors. Keep items that you use a lot in easy-to-reach places. If you need to reach something above you, use a strong step stool that has a grab bar. Keep electrical cords out of the way. Do not use floor polish or wax that makes floors slippery. If you must use wax, use non-skid floor wax. Do not have throw rugs and other things on the floor that can  make you trip. What can I do with my stairs? Do not leave any items on the stairs. Make sure that there are handrails on both sides of the stairs and use them. Fix handrails that are broken or loose. Make sure that handrails are as long as the stairways. Check any carpeting to make sure that it is firmly attached to the stairs. Fix any carpet that is loose or worn. Avoid having throw rugs at the top or bottom of the stairs. If you do have throw rugs, attach them to the floor with carpet tape. Make sure that you have a light switch at the top of the stairs and the bottom of the stairs. If you do not have them, ask someone to add them for you. What else can I do to help prevent  falls? Wear shoes that: Do not have high heels. Have rubber bottoms. Are comfortable and fit you well. Are closed at the toe. Do not wear sandals. If you use a stepladder: Make sure that it is fully opened. Do not climb a closed stepladder. Make sure that both sides of the stepladder are locked into place. Ask someone to hold it for you, if possible. Clearly mark and make sure that you can see: Any grab bars or handrails. First and last steps. Where the edge of each step is. Use tools that help you move around (mobility aids) if they are needed. These include: Canes. Walkers. Scooters. Crutches. Turn on the lights when you go into a dark area. Replace any light bulbs as soon as they burn out. Set up your furniture so you have a clear path. Avoid moving your furniture around. If any of your floors are uneven, fix them. If there are any pets around you, be aware of where they are. Review your medicines with your doctor. Some medicines can make you feel dizzy. This can increase your chance of falling. Ask your doctor what other things that you can do to help prevent falls. This information is not intended to replace advice given to you by your health care provider. Make sure you discuss any questions you have with your health care provider. Document Released: 09/08/2009 Document Revised: 04/19/2016 Document Reviewed: 12/17/2014 Elsevier Interactive Patient Education  2017 [employer].

## 2021-11-08 ENCOUNTER — Other Ambulatory Visit: Payer: Self-pay | Admitting: Internal Medicine

## 2021-11-14 ENCOUNTER — Telehealth: Payer: Self-pay | Admitting: Internal Medicine

## 2021-11-14 NOTE — Telephone Encounter (Signed)
Patient requesting a call back to discuss which pneumonia vaccine she can receive

## 2021-11-15 NOTE — Telephone Encounter (Signed)
Called pt inform her she already had a prevnar 13 back in 2019. There is a prevnar 20 that is out, but she will have to get from pharmacy due to Medicare. Inform pt booster pneumonia are given every 5-7 years. Pt understood, and will have pharmacy to send [place] documentation if she get the 20..Gordan Payment

## 2022-02-24 ENCOUNTER — Other Ambulatory Visit: Payer: Self-pay | Admitting: Internal Medicine

## 2022-04-27 DIAGNOSIS — H524 Presbyopia: Secondary | ICD-10-CM | POA: Diagnosis not present

## 2022-05-08 DIAGNOSIS — Z0100 Encounter for examination of eyes and vision without abnormal findings: Secondary | ICD-10-CM | POA: Diagnosis not present

## 2022-05-27 ENCOUNTER — Other Ambulatory Visit: Payer: Self-pay | Admitting: Internal Medicine

## 2022-06-06 ENCOUNTER — Other Ambulatory Visit: Payer: Self-pay | Admitting: Internal Medicine

## 2022-07-10 ENCOUNTER — Other Ambulatory Visit: Payer: Self-pay | Admitting: Internal Medicine

## 2022-08-01 ENCOUNTER — Other Ambulatory Visit: Payer: Self-pay | Admitting: Internal Medicine

## 2022-08-02 DIAGNOSIS — Z01419 Encounter for gynecological examination (general) (routine) without abnormal findings: Secondary | ICD-10-CM | POA: Diagnosis not present

## 2022-08-02 DIAGNOSIS — Z6827 Body mass index (BMI) 27.0-27.9, adult: Secondary | ICD-10-CM | POA: Diagnosis not present

## 2022-08-02 DIAGNOSIS — Z1231 Encounter for screening mammogram for malignant neoplasm of breast: Secondary | ICD-10-CM | POA: Diagnosis not present

## 2022-08-14 ENCOUNTER — Ambulatory Visit (INDEPENDENT_AMBULATORY_CARE_PROVIDER_SITE_OTHER): Payer: Medicare HMO | Admitting: Internal Medicine

## 2022-08-14 ENCOUNTER — Encounter: Payer: Self-pay | Admitting: Internal Medicine

## 2022-08-14 VITALS — BP 148/82 | HR 78 | Temp 97.7°F | Ht 63.0 in | Wt 148.4 lb

## 2022-08-14 DIAGNOSIS — I10 Essential (primary) hypertension: Secondary | ICD-10-CM

## 2022-08-14 DIAGNOSIS — Z23 Encounter for immunization: Secondary | ICD-10-CM | POA: Diagnosis not present

## 2022-08-14 DIAGNOSIS — M25532 Pain in left wrist: Secondary | ICD-10-CM | POA: Diagnosis not present

## 2022-08-14 DIAGNOSIS — L509 Urticaria, unspecified: Secondary | ICD-10-CM | POA: Diagnosis not present

## 2022-08-14 DIAGNOSIS — Z8249 Family history of ischemic heart disease and other diseases of the circulatory system: Secondary | ICD-10-CM

## 2022-08-14 DIAGNOSIS — Z0000 Encounter for general adult medical examination without abnormal findings: Secondary | ICD-10-CM | POA: Diagnosis not present

## 2022-08-14 LAB — LIPID PANEL
Cholesterol: 198 mg/dL (ref 0–200)
HDL: 48.2 mg/dL (ref >39.00)
LDL Cholesterol: 127 mg/dL — ABNORMAL HIGH (ref 0–99)
NonHDL: 149.32
Total CHOL/HDL Ratio: 4
Triglycerides: 112 mg/dL (ref 0.0–149.0)
VLDL: 22.4 mg/dL (ref 0.0–40.0)

## 2022-08-14 LAB — COMPREHENSIVE METABOLIC PANEL
ALT: 24 U/L (ref 0–35)
AST: 25 U/L (ref 0–37)
Albumin: 4.4 g/dL (ref 3.5–5.2)
Alkaline Phosphatase: 45 U/L (ref 39–117)
BUN: 10 mg/dL (ref 6–23)
CO2: 29 mEq/L (ref 19–32)
Calcium: 10.3 mg/dL (ref 8.4–10.5)
Chloride: 100 mEq/L (ref 96–112)
Creatinine, Ser: 0.75 mg/dL (ref 0.40–1.20)
GFR: 80.81 mL/min (ref >60.00)
Glucose, Bld: 100 mg/dL — ABNORMAL HIGH (ref 70–99)
Potassium: 3.9 mEq/L (ref 3.5–5.1)
Sodium: 137 mEq/L (ref 135–145)
Total Bilirubin: 0.6 mg/dL (ref 0.2–1.2)
Total Protein: 7.8 g/dL (ref 6.0–8.3)

## 2022-08-14 LAB — URINALYSIS
Bilirubin Urine: NEGATIVE
Hgb urine dipstick: NEGATIVE
Ketones, ur: NEGATIVE
Leukocytes,Ua: NEGATIVE
Nitrite: NEGATIVE
Specific Gravity, Urine: <=1.005 — AB (ref 1.000–1.030)
Total Protein, Urine: NEGATIVE
Urine Glucose: NEGATIVE
Urobilinogen, UA: 0.2 (ref 0.0–1.0)
pH: 7 (ref 5.0–8.0)

## 2022-08-14 LAB — CBC WITH DIFFERENTIAL/PLATELET
Basophils Absolute: 0 10*3/uL (ref 0.0–0.1)
Basophils Relative: 1 % (ref 0.0–3.0)
Eosinophils Absolute: 0.3 10*3/uL (ref 0.0–0.7)
Eosinophils Relative: 5.9 % — ABNORMAL HIGH (ref 0.0–5.0)
HCT: 42.3 % (ref 36.0–46.0)
Hemoglobin: 13.6 g/dL (ref 12.0–15.0)
Lymphocytes Relative: 42.5 % (ref 12.0–46.0)
Lymphs Abs: 2 10*3/uL (ref 0.7–4.0)
MCHC: 32.2 g/dL (ref 30.0–36.0)
MCV: 83.7 fl (ref 78.0–100.0)
Monocytes Absolute: 0.3 10*3/uL (ref 0.1–1.0)
Monocytes Relative: 7.3 % (ref 3.0–12.0)
Neutro Abs: 2.1 10*3/uL (ref 1.4–7.7)
Neutrophils Relative %: 43.3 % (ref 43.0–77.0)
Platelets: 276 10*3/uL (ref 150.0–400.0)
RBC: 5.05 Mil/uL (ref 3.87–5.11)
RDW: 13.5 % (ref 11.5–15.5)
WBC: 4.8 10*3/uL (ref 4.0–10.5)

## 2022-08-14 LAB — TSH: TSH: 0.66 u[IU]/mL (ref 0.35–5.50)
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
## 2022-08-14 NOTE — Assessment & Plan Note (Signed)
Overall better Get a De Quervain's tenosynovitis splint Voltaren gel

## 2022-08-14 NOTE — Patient Instructions (Addendum)
Get a De Quervain's tenosynovitis splint    Cardiac CT calcium scoring test $99    Computed tomography, more commonly known as a CT or CAT scan, is a diagnostic medical imaging test. Like traditional x-rays, it produces multiple images or pictures of the inside of the body. The cross-sectional images generated during a CT scan can be reformatted in multiple planes. They can even generate three-dimensional images. These images can be viewed on a computer monitor, printed on film or by a 3D printer, or transferred to a CD or DVD. CT images of internal organs, bones, soft tissue and blood vessels provide greater detail than traditional x-rays, particularly of soft tissues and blood vessels. A cardiac CT scan for coronary calcium is a non-invasive way of obtaining information about the presence, location and extent of calcified plaque in the coronary arteries--the vessels that supply oxygen-containing blood to the heart muscle. Calcified plaque results when there is a build-up of fat and other substances under the inner layer of the artery. This material can calcify which signals the presence of atherosclerosis, a disease of the vessel wall, also called coronary artery disease (CAD). People with this disease have an increased risk for heart attacks. In addition, over time, progression of plaque build up (CAD) can narrow the arteries or even close off blood flow to the heart. The result may be chest pain, sometimes called "angina," or a heart attack. Because calcium is a marker of CAD, the amount of calcium detected on a cardiac CT scan is a helpful prognostic tool. The findings on cardiac CT are expressed as a calcium score. Another name for this test is coronary artery calcium scoring.  What are some common uses of the procedure? The goal of cardiac CT scan for calcium scoring is to determine if CAD is present and to what extent, even if there are no symptoms. It is a screening study that may be  recommended by a physician for patients with risk factors for CAD but no clinical symptoms. The major risk factors for CAD are: high blood cholesterol levels  family history of heart attacks  diabetes  high blood pressure  cigarette smoking  overweight or obese  physical inactivity   A negative cardiac CT scan for calcium scoring shows no calcification within the coronary arteries. This suggests that CAD is absent or so minimal it cannot be seen by this technique. The chance of having a heart attack over the next two to five years is very low under these circumstances. A positive test means that CAD is present, regardless of whether or not the patient is experiencing any symptoms. The amount of calcification--expressed as the calcium score--may help to predict the likelihood of a myocardial infarction (heart attack) in the coming years and helps your medical doctor or cardiologist decide whether the patient may need to take preventive medicine or undertake other measures such as diet and exercise to lower the risk for heart attack. The extent of CAD is graded according to your calcium score:  Calcium Score  Presence of CAD (coronary artery disease)  0 No evidence of CAD   1-10 Minimal evidence of CAD  11-100 Mild evidence of CAD  101-400 Moderate evidence of CAD  Over 400 Extensive evidence of CAD   Coronary artery calcium (CAC) score is a strong predictor of incident coronary heart disease (CHD) and provides predictive information beyond traditional risk factors. CAC scoring is reasonable to use in the decision to withhold, postpone, or initiate statin therapy  in intermediate-risk or selected borderline-risk asymptomatic adults (age 16-75 years and LDL-C >=70 to <190 mg/dL) who do not have diabetes or established atherosclerotic cardiovascular disease (ASCVD).* In intermediate-risk (10-year ASCVD risk >=7.5% to <20%) adults or selected borderline-risk (10-year ASCVD risk >=5% to  <7.5%) adults in whom a CAC score is measured for the purpose of making a treatment decision the following recommendations have been made:   If CAC=0, it is reasonable to withhold statin therapy and reassess in 5 to 10 years, as long as higher risk conditions are absent (diabetes mellitus, family history of premature CHD in first degree relatives (males <55 years; females <65 years), cigarette smoking, or LDL >=190 mg/dL).   If CAC is 1 to 99, it is reasonable to initiate statin therapy for patients >=55 years of age.   If CAC is >=100 or >=75th percentile, it is reasonable to initiate statin therapy at any age.   Cardiology referral should be considered for patients with CAC scores >=400 or >=75th percentile.   *2018 AHA/ACC/AACVPR/AAPA/ABC/ACPM/ADA/AGS/APhA/ASPC/NLA/PCNA Guideline on the Management of Blood Cholesterol: A Report of the American College of Cardiology/American Heart Association Task Force on Clinical Practice Guidelines. J Am Coll Cardiol. 2019;73(24):3168-3209.

## 2022-08-14 NOTE — Assessment & Plan Note (Addendum)
  We discussed age appropriate health related issues, including available/recomended screening tests and vaccinations. Labs were ordered to be later reviewed . All questions were answered. We discussed one or more of the following - seat belt use, use of sunscreen/sun exposure exercise, fall risk reduction, second hand smoke exposure, firearm use and storage, seat belt use, a need for adhering to healthy diet and exercise. Labs were ordered.  All questions were answered. Due PAP, mammo Ophth exam q 1 year Colon due in 2027 Dr Fuller Plan Coronary calcium score chest CT

## 2022-08-23 ENCOUNTER — Other Ambulatory Visit: Payer: Self-pay | Admitting: Internal Medicine

## 2022-08-24 ENCOUNTER — Other Ambulatory Visit: Payer: Self-pay | Admitting: Internal Medicine

## 2022-09-09 ENCOUNTER — Other Ambulatory Visit: Payer: Self-pay | Admitting: Internal Medicine

## 2022-09-26 ENCOUNTER — Ambulatory Visit
Admission: RE | Admit: 2022-09-26 | Discharge: 2022-09-26 | Disposition: A | Discharge status: Home / Self Care | Payer: Medicare HMO | Source: Ambulatory Visit | Attending: Internal Medicine | Admitting: Internal Medicine

## 2022-09-26 DIAGNOSIS — Z8249 Family history of ischemic heart disease and other diseases of the circulatory system: Secondary | ICD-10-CM

## 2022-09-26 DIAGNOSIS — I700 Atherosclerosis of aorta: Secondary | ICD-10-CM | POA: Diagnosis not present

## 2022-11-08 ENCOUNTER — Ambulatory Visit (INDEPENDENT_AMBULATORY_CARE_PROVIDER_SITE_OTHER): Payer: Medicare HMO

## 2022-11-08 VITALS — Ht 63.0 in | Wt 145.0 lb

## 2022-11-08 DIAGNOSIS — Z0000 Encounter for general adult medical examination without abnormal findings: Secondary | ICD-10-CM

## 2022-11-08 NOTE — Patient Instructions (Signed)
Dana Long , Thank you for taking time to come for your Medicare Wellness Visit. I appreciate your ongoing commitment to your health goals. Please review the following plan we discussed and let me know if I can assist you in the future.   These are the goals we discussed:  Goals       Patient Stated (pt-stated)      "Catch me if you can"  that's my motto.  I will continue to stay physically, socially and mentally active.        This is a list of the screening recommended for you and due dates:  Health Maintenance  Topic Date Due   Zoster (Shingles) Vaccine (1 of 2) Never done   COVID-19 Vaccine (4 - 2023-24 season) 07/27/2022   Medicare Annual Wellness Visit  11/09/2023   Mammogram  08/02/2024   Colon Cancer Screening  03/06/2026   DTaP/Tdap/Td vaccine (3 - Td or Tdap) 12/23/2027   Pneumonia Vaccine  Completed   Flu Shot  Completed   DEXA scan (bone density measurement)  Completed   Hepatitis C Screening: USPSTF Recommendation to screen - Ages 14-79 yo.  Completed   HPV Vaccine  Aged Out    Advanced directives: Advance directive discussed with you today. I have provided a copy for you to complete at home and have notarized. Once this is complete please bring a copy in to our office so we can scan it into your chart.  Conditions/risks identified: Yes  Next appointment: Follow up in one year for your annual wellness visit.   Preventive Care 70 Years and Older, Female Preventive care refers to lifestyle choices and visits with your health care provider that can promote health and wellness. What does preventive care include? A yearly physical exam. This is also called an annual well check. Dental exams once or twice a year. Routine eye exams. Ask your health care provider how often you should have your eyes checked. Personal lifestyle choices, including: Daily care of your teeth and gums. Regular physical activity. Eating a healthy diet. Avoiding tobacco and drug  use. Limiting alcohol use. Practicing safe sex. Taking low-dose aspirin every day. Taking vitamin and mineral supplements as recommended by your health care provider. What happens during an annual well check? The services and screenings done by your health care provider during your annual well check will depend on your age, overall health, lifestyle risk factors, and family history of disease. Counseling  Your health care provider may ask you questions about your: Alcohol use. Tobacco use. Drug use. Emotional well-being. Home and relationship well-being. Sexual activity. Eating habits. History of falls. Memory and ability to understand (cognition). Work and work Astronomer. Reproductive health. Screening  You may have the following tests or measurements: Height, weight, and BMI. Blood pressure. Lipid and cholesterol levels. These may be checked every 5 years, or more frequently if you are over 70 years old. Skin check. Lung cancer screening. You may have this screening every year starting at age 26 if you have a 30-pack-year history of smoking and currently smoke or have quit within the past 15 years. Fecal occult blood test (FOBT) of the stool. You may have this test every year starting at age 72. Flexible sigmoidoscopy or colonoscopy. You may have a sigmoidoscopy every 5 years or a colonoscopy every 10 years starting at age 75. Hepatitis C blood test. Hepatitis B blood test. Sexually transmitted disease (STD) testing. Diabetes screening. This is done by checking your blood sugar (glucose)  after you have not eaten for a while (fasting). You may have this done every 1-3 years. Bone density scan. This is done to screen for osteoporosis. You may have this done starting at age 40. Mammogram. This may be done every 1-2 years. Talk to your health care provider about how often you should have regular mammograms. Talk with your health care provider about your test results, treatment  options, and if necessary, the need for more tests. Vaccines  Your health care provider may recommend certain vaccines, such as: Influenza vaccine. This is recommended every year. Tetanus, diphtheria, and acellular pertussis (Tdap, Td) vaccine. You may need a Td booster every 10 years. Zoster vaccine. You may need this after age 70. Pneumococcal 13-valent conjugate (PCV13) vaccine. One dose is recommended after age 70. Pneumococcal polysaccharide (PPSV23) vaccine. One dose is recommended after age 70. Talk to your health care provider about which screenings and vaccines you need and how often you need them. This information is not intended to replace advice given to you by your health care provider. Make sure you discuss any questions you have with your health care provider. Document Released: 12/09/2015 Document Revised: 08/01/2016 Document Reviewed: 09/13/2015 Elsevier Interactive Patient Education  2017 [place] Prevention in the Home Falls can cause injuries. They can happen to people of all ages. There are many things you can do to make your home safe and to help prevent falls. What can I do on the outside of my home? Regularly fix the edges of walkways and driveways and fix any cracks. Remove anything that might make you trip as you walk through a door, such as a raised step or threshold. Trim any bushes or trees on the path to your home. Use bright outdoor lighting. Clear any walking paths of anything that might make someone trip, such as rocks or tools. Regularly check to see if handrails are loose or broken. Make sure that both sides of any steps have handrails. Any raised decks and porches should have guardrails on the edges. Have any leaves, snow, or ice cleared regularly. Use sand or salt on walking paths during winter. Clean up any spills in your garage right away. This includes oil or grease spills. What can I do in the bathroom? Use night lights. Install grab  bars by the toilet and in the tub and shower. Do not use towel bars as grab bars. Use non-skid mats or decals in the tub or shower. If you need to sit down in the shower, use a plastic, non-slip stool. Keep the floor dry. Clean up any water that spills on the floor as soon as it happens. Remove soap buildup in the tub or shower regularly. Attach bath mats securely with double-sided non-slip rug tape. Do not have throw rugs and other things on the floor that can make you trip. What can I do in the bedroom? Use night lights. Make sure that you have a light by your bed that is easy to reach. Do not use any sheets or blankets that are too big for your bed. They should not hang down onto the floor. Have a firm chair that has side arms. You can use this for support while you get dressed. Do not have throw rugs and other things on the floor that can make you trip. What can I do in the kitchen? Clean up any spills right away. Avoid walking on wet floors. Keep items that you use a lot in easy-to-reach places. If  you need to reach something above you, use a strong step stool that has a grab bar. Keep electrical cords out of the way. Do not use floor polish or wax that makes floors slippery. If you must use wax, use non-skid floor wax. Do not have throw rugs and other things on the floor that can make you trip. What can I do with my stairs? Do not leave any items on the stairs. Make sure that there are handrails on both sides of the stairs and use them. Fix handrails that are broken or loose. Make sure that handrails are as long as the stairways. Check any carpeting to make sure that it is firmly attached to the stairs. Fix any carpet that is loose or worn. Avoid having throw rugs at the top or bottom of the stairs. If you do have throw rugs, attach them to the floor with carpet tape. Make sure that you have a light switch at the top of the stairs and the bottom of the stairs. If you do not have them,  ask someone to add them for you. What else can I do to help prevent falls? Wear shoes that: Do not have high heels. Have rubber bottoms. Are comfortable and fit you well. Are closed at the toe. Do not wear sandals. If you use a stepladder: Make sure that it is fully opened. Do not climb a closed stepladder. Make sure that both sides of the stepladder are locked into place. Ask someone to hold it for you, if possible. Clearly mark and make sure that you can see: Any grab bars or handrails. First and last steps. Where the edge of each step is. Use tools that help you move around (mobility aids) if they are needed. These include: Canes. Walkers. Scooters. Crutches. Turn on the lights when you go into a dark area. Replace any light bulbs as soon as they burn out. Set up your furniture so you have a clear path. Avoid moving your furniture around. If any of your floors are uneven, fix them. If there are any pets around you, be aware of where they are. Review your medicines with your doctor. Some medicines can make you feel dizzy. This can increase your chance of falling. Ask your doctor what other things that you can do to help prevent falls. This information is not intended to replace advice given to you by your health care provider. Make sure you discuss any questions you have with your health care provider. Document Released: 09/08/2009 Document Revised: 04/19/2016 Document Reviewed: 12/17/2014 Elsevier Interactive Patient Education  2017 [employer].

## 2022-11-08 NOTE — Progress Notes (Addendum)
Virtual Visit via Telephone Note  I connected with  Dana Long on 11/08/22 at  8:45 AM EST by telephone and verified that I am speaking with the correct person using two identifiers.  Location: Patient: Home Provider: [facility] Persons participating in the virtual visit: patient/Nurse Health Advisor   I discussed the limitations, risks, security and privacy concerns of performing an evaluation and management service by telephone and the availability of in person appointments. The patient expressed understanding and agreed to proceed.  Interactive audio and video telecommunications were attempted between this nurse and patient, however failed, due to patient having technical difficulties OR patient did not have access to video capability.  We continued and completed visit with audio only.  Some vital signs may be absent or patient reported.   Mickeal Needy, LPN  Subjective:   Dana Long is a 71 y.o. female who presents for Medicare Annual (Subsequent) preventive examination.  Review of Systems     Cardiac Risk Factors include: advanced age (>78men, >22 women);family history of premature cardiovascular disease;hypertension     Objective:    Today's Vitals   11/08/22 0846  Weight: 145 lb (65.8 kg)  Height: 5\' 3"  (1.6 m)  PainSc: 0-No pain   Body mass index is 25.69 kg/m.     11/08/2022    8:49 AM 11/07/2021    8:49 AM 09/14/2020    8:53 AM 07/15/2019    9:35 AM 08/31/2018   11:34 AM 02/21/2016    3:16 PM  Advanced Directives  Does Patient Have a Medical Advance Directive? No No No No No No  Would patient like information on creating a medical advance directive? Yes (MAU/Ambulatory/Procedural Areas - Information given) No - Patient declined Yes (MAU/Ambulatory/Procedural Areas - Information given) No - Patient declined  No - patient declined information    Current Medications (verified) Outpatient Encounter Medications as of 11/08/2022  Medication  Sig   amLODipine (NORVASC) 5 MG tablet Take 1 tablet (5 mg total) by mouth daily.   Calcium-Phosphorus-Vitamin D (CITRACAL +D3 PO) Take 1 each by mouth daily. Reported on 02/21/2016   fluticasone (FLONASE) 50 MCG/ACT nasal spray USE 2 SPRAYS IN EACH NOSTRIL EVERY DAY   ibuprofen (ADVIL) 600 MG tablet TAKE 1 TABLET TWICE A DAY FOR 1 WEEK THEN AS NEEDED FOR PAIN   ipratropium (ATROVENT) 0.06 % nasal spray USE 2 SPRAYS IN EACH NOSTRIL 3 TIMES A DAY   mupirocin ointment (BACTROBAN) 2 % On leg wound w/dressing change qd or bid   telmisartan (MICARDIS) 40 MG tablet Take 1 tablet (40 mg total) by mouth daily.   No facility-administered encounter medications on file as of 11/08/2022.    Allergies (verified) Maxzide [hydrochlorothiazide w-triamterene]   History: Past Medical History:  Diagnosis Date   GERD (gastroesophageal reflux disease)    Gout    ?   Hypertension    Past Surgical History:  Procedure Laterality Date   TUBAL LIGATION     Family History  Problem Relation Age of Onset   Gout Father    Diabetes Mother    Diabetes Sister    Diabetes Brother    Diabetes Other    Hypertension Other    Colon cancer Neg Hx    Social History   Socioeconomic History   Marital status: Single    Spouse name: Not on file   Number of children: Not on file   Years of education: Not on file   Highest education level: Not on file  Occupational History   Not on file  Tobacco Use   Smoking status: Former   Smokeless tobacco: Never  Substance and Sexual Activity   Alcohol use: No    Alcohol/week: 0.0 standard drinks of alcohol   Drug use: No   Sexual activity: Yes  Other Topics Concern   Not on file  Social History Narrative   Not on file   Social Determinants of Health   Financial Resource Strain: Low Risk  (11/08/2022)   Overall Financial Resource Strain (CARDIA)    Difficulty of Paying Living Expenses: Not hard at all  Food Insecurity: No Food Insecurity (11/08/2022)   Hunger  Vital Sign    Worried About Running Out of Food in the Last Year: Never true    Ran Out of Food in the Last Year: Never true  Transportation Needs: No Transportation Needs (11/08/2022)   PRAPARE - Administrator, Civil ServiceTransportation    Lack of Transportation (Medical): No    Lack of Transportation (Non-Medical): No  Physical Activity: Sufficiently Active (11/08/2022)   Exercise Vital Sign    Days of Exercise per Week: 7 days    Minutes of Exercise per Session: 30 min  Stress: No Stress Concern Present (11/08/2022)   [employer] - Occupational Stress Questionnaire    Feeling of Stress : Not at all  Social Connections: Moderately Integrated (11/08/2022)   Social Connection and Isolation Panel [NHANES]    Frequency of Communication with Friends and Family: More than three times a week    Frequency of Social Gatherings with Friends and Family: More than three times a week    Attends Religious Services: More than 4 times per year    Active Member of [employer] or Organizations: Yes    Attends BankerClub or Organization Meetings: More than 4 times per year    Marital Status: Widowed    Tobacco Counseling Counseling given: Not Answered   Clinical Intake:  Pre-visit preparation completed: Yes  Pain : No/denies pain Pain Score: 0-No pain     BMI - recorded: 25.69 Nutritional Status: BMI 25 -29 Overweight Nutritional Risks: None Diabetes: No  How often do you need to have someone help you when you read instructions, pamphlets, or other written materials from your doctor or pharmacy?: 1 - Never What is the last grade level you completed in school?: HSG  Diabetic? No  Interpreter Needed?: No  Information entered by :: Susie CassetteShenika Firas Guardado, LPN.   Activities of Daily Living    11/08/2022    8:54 AM  In your present state of health, do you have any difficulty performing the following activities:  Hearing? 0  Vision? 0  Difficulty concentrating or making decisions? 0  Walking or  climbing stairs? 0  Dressing or bathing? 0  Doing errands, shopping? 0  Preparing Food and eating ? N  Using the Toilet? N  In the past six months, have you accidently leaked urine? N  Do you have problems with loss of bowel control? N  Managing your Medications? N  Managing your Finances? N  Housekeeping or managing your Housekeeping? N    Patient Care Team: Plotnikov, Georgina QuintAleksei V, MD as PCP - General (Internal Medicine) Gennie Almaurtis, Ann Cruz, CNM (Inactive) as Midwife (Obstetrics and Gynecology) [facility] as Catering managerConsulting Physician (Optometry) [place], [facility] as Consulting Physician (Obstetrics and Gynecology)  Indicate any recent Medical Services you may have received from other than [facility] providers in the past year (date may be approximate).  Assessment:   This is a routine wellness examination for Dana Long.  Hearing/Vision screen Hearing Screening - Comments:: Denies hearing difficulties   Vision Screening - Comments:: Wears rx glasses - up to date with routine eye exams with [facility]  Dietary issues and exercise activities discussed: Current Exercise Habits: Structured exercise class, Type of exercise: walking, Time (Minutes): 30, Frequency (Times/Week): 7, Weekly Exercise (Minutes/Week): 210, Intensity: Moderate, Exercise limited by: None identified   Goals Addressed   None   Depression Screen    11/08/2022    8:57 AM 11/07/2021    8:53 AM 08/10/2021   11:22 AM 09/14/2020    8:51 AM 05/27/2019    9:11 AM 12/24/2017    2:44 PM 04/08/2017    4:10 PM  PHQ 2/9 Scores  PHQ - 2 Score 0 0 0 0 0 0 0  PHQ- 9 Score   0        Fall Risk    11/08/2022    8:51 AM 11/07/2021    8:51 AM 08/10/2021   11:22 AM 09/14/2020    8:54 AM 05/27/2019    9:11 AM  Fall Risk   Falls in the past year? 0 0 0 0 0  Number falls in past yr: 0 0 0 0   Injury with Fall? 0 0 0 0   Risk for fall due to : No Fall Risks No Fall Risks No Fall Risks  No Fall Risks   Follow up Falls prevention discussed Falls prevention discussed  Falls evaluation completed Falls evaluation completed    FALL RISK PREVENTION PERTAINING TO THE HOME:  Any stairs in or around the home? No  If so, are there any without handrails? No  Home free of loose throw rugs in walkways, pet beds, electrical cords, etc? Yes  Adequate lighting in your home to reduce risk of falls? Yes   ASSISTIVE DEVICES UTILIZED TO PREVENT FALLS:  Life alert? No  Use of a cane, walker or w/c? No  Grab bars in the bathroom? Yes  Shower chair or bench in shower? No  Elevated toilet seat or a handicapped toilet? No   TIMED UP AND GO:  Was the test performed? No . Phone Visit   Cognitive Function:        11/08/2022    8:54 AM 09/14/2020    8:55 AM  6CIT Screen  What Year? 0 points 0 points  What month? 0 points 0 points  What time? 0 points 0 points  Count back from 20 0 points 0 points  Months in reverse 0 points 0 points  Repeat phrase 0 points 0 points  Total Score 0 points 0 points    Immunizations Immunization History  Administered Date(s) Administered   Fluad Quad(high Dose 65+) 07/29/2019, 08/10/2021, 08/14/2022   Hepatitis B, adult 04/04/2015, 05/02/2015, 03/20/2016   Influenza Split 09/06/2016, 07/24/2017   Influenza,inj,Quad PF,6+ Mos 11/11/2013, 11/16/2014, 08/26/2015   Influenza-Unspecified 07/17/2018   PFIZER(Purple Top)SARS-COV-2 Vaccination 12/18/2019, 01/08/2020, 09/17/2020   PNEUMOCOCCAL CONJUGATE-20 11/15/2021   Pneumococcal Conjugate-13 07/11/2018   Tdap 11/14/2012, 12/22/2017   Zoster, Live 06/16/2013    TDAP status: Up to date  Flu Vaccine status: Up to date  Pneumococcal vaccine status: Up to date  Covid-19 vaccine status: Completed vaccines  Qualifies for Shingles Vaccine? Yes   Zostavax completed Yes   Shingrix Completed?: No.    Education has been provided regarding the importance of this vaccine. Patient has been advised to  call insurance company  to determine out of pocket expense if they have not yet received this vaccine. Advised may also receive vaccine at local pharmacy or Health Dept. Verbalized acceptance and understanding.  Screening Tests Health Maintenance  Topic Date Due   Zoster Vaccines- Shingrix (1 of 2) Never done   COVID-19 Vaccine (4 - 2023-24 season) 07/27/2022   Medicare Annual Wellness (AWV)  11/09/2023   MAMMOGRAM  08/02/2024   COLONOSCOPY (Pts 45-29yrs Insurance coverage will need to be confirmed)  03/06/2026   DTaP/Tdap/Td (3 - Td or Tdap) 12/23/2027   Pneumonia Vaccine 61+ Years old  Completed   INFLUENZA VACCINE  Completed   DEXA SCAN  Completed   Hepatitis C Screening  Completed   HPV VACCINES  Aged Out    Health Maintenance  Health Maintenance Due  Topic Date Due   Zoster Vaccines- Shingrix (1 of 2) Never done   COVID-19 Vaccine (4 - 2023-24 season) 07/27/2022    Colorectal cancer screening: Type of screening: Colonoscopy. Completed 03/06/2016. Repeat every 10 years  Mammogram status: Completed 08/02/2022. Repeat every year  Bone Density status: Completed 04/13/2020. Results reflect: Bone density results: NORMAL. Repeat every 5 years.  Lung Cancer Screening: (Low Dose CT Chest recommended if Age 23-80 years, 30 pack-year currently smoking OR have quit w/in 15years.) does not qualify.   Lung Cancer Screening Referral: no  Additional Screening:  Hepatitis C Screening: does qualify; Completed 03/20/2016  Vision Screening: Recommended annual ophthalmology exams for early detection of glaucoma and other disorders of the eye. Is the patient up to date with their annual eye exam?  Yes  Who is the provider or what is the name of the office in which the patient attends annual eye exams? [facility]  If pt is not established with a provider, would they like to be referred to a provider to establish care? No .   Dental Screening: Recommended annual dental exams for  proper oral hygiene  Community Resource Referral / Chronic Care Management: CRR required this visit?  No   CCM required this visit?  No      Plan:     I have personally reviewed and noted the following in the patient's chart:   Medical and social history Use of alcohol, tobacco or illicit drugs  Current medications and supplements including opioid prescriptions. Patient is not currently taking opioid prescriptions. Functional ability and status Nutritional status Physical activity Advanced directives List of other physicians Hospitalizations, surgeries, and ER visits in previous 12 months Vitals Screenings to include cognitive, depression, and falls Referrals and appointments  In addition, I have reviewed and discussed with patient certain preventive protocols, quality metrics, and best practice recommendations. A written personalized care plan for preventive services as well as general preventive health recommendations were provided to patient.     Mickeal Needy, LPN   [id number]/[id number]   Nurse Notes: N/A   Medical screening examination/treatment/procedure(s) were performed by non-physician practitioner and as supervising physician I was immediately available for consultation/collaboration.  I agree with above. Jacinta Shoe, MD

## 2022-11-14 ENCOUNTER — Other Ambulatory Visit: Payer: Medicare HMO

## 2022-11-22 ENCOUNTER — Other Ambulatory Visit: Payer: Self-pay | Admitting: Internal Medicine

## 2022-12-16 ENCOUNTER — Other Ambulatory Visit: Payer: Self-pay | Admitting: Internal Medicine

## 2023-01-12 ENCOUNTER — Other Ambulatory Visit: Payer: Self-pay | Admitting: Internal Medicine

## 2023-02-12 ENCOUNTER — Encounter: Payer: Self-pay | Admitting: Internal Medicine

## 2023-02-12 ENCOUNTER — Ambulatory Visit (INDEPENDENT_AMBULATORY_CARE_PROVIDER_SITE_OTHER): Payer: Medicare HMO | Admitting: Internal Medicine

## 2023-02-12 VITALS — BP 120/80 | HR 82 | Temp 98.3°F | Ht 63.0 in | Wt 147.0 lb

## 2023-02-12 DIAGNOSIS — I10 Essential (primary) hypertension: Secondary | ICD-10-CM

## 2023-02-12 DIAGNOSIS — R739 Hyperglycemia, unspecified: Secondary | ICD-10-CM | POA: Diagnosis not present

## 2023-02-12 DIAGNOSIS — J301 Allergic rhinitis due to pollen: Secondary | ICD-10-CM | POA: Diagnosis not present

## 2023-02-12 DIAGNOSIS — I2510 Atherosclerotic heart disease of native coronary artery without angina pectoris: Secondary | ICD-10-CM | POA: Diagnosis not present

## 2023-02-12 DIAGNOSIS — R7989 Other specified abnormal findings of blood chemistry: Secondary | ICD-10-CM | POA: Diagnosis not present

## 2023-02-12 DIAGNOSIS — I2583 Coronary atherosclerosis due to lipid rich plaque: Secondary | ICD-10-CM | POA: Diagnosis not present

## 2023-02-12 DIAGNOSIS — K7400 Hepatic fibrosis, unspecified: Secondary | ICD-10-CM

## 2023-02-12 DIAGNOSIS — L309 Dermatitis, unspecified: Secondary | ICD-10-CM | POA: Insufficient documentation

## 2023-02-12 LAB — COMPREHENSIVE METABOLIC PANEL
ALT: 15 U/L (ref 0–35)
AST: 20 U/L (ref 0–37)
Albumin: 4.4 g/dL (ref 3.5–5.2)
Alkaline Phosphatase: 46 U/L (ref 39–117)
BUN: 11 mg/dL (ref 6–23)
CO2: 29 mEq/L (ref 19–32)
Calcium: 10.1 mg/dL (ref 8.4–10.5)
Chloride: 99 mEq/L (ref 96–112)
Creatinine, Ser: 0.8 mg/dL (ref 0.40–1.20)
GFR: 74.53 mL/min (ref >60.00)
Glucose, Bld: 109 mg/dL — ABNORMAL HIGH (ref 70–99)
Potassium: 3.7 mEq/L (ref 3.5–5.1)
Sodium: 138 mEq/L (ref 135–145)
Total Bilirubin: 0.5 mg/dL (ref 0.2–1.2)
Total Protein: 7.7 g/dL (ref 6.0–8.3)

## 2023-02-12 LAB — LIPID PANEL
Cholesterol: 196 mg/dL (ref 0–200)
HDL: 53.8 mg/dL (ref >39.00)
LDL Cholesterol: 124 mg/dL — ABNORMAL HIGH (ref 0–99)
NonHDL: 141.99
Total CHOL/HDL Ratio: 4
Triglycerides: 91 mg/dL (ref 0.0–149.0)
VLDL: 18.2 mg/dL (ref 0.0–40.0)

## 2023-02-12 LAB — HEMOGLOBIN A1C: Hgb A1c MFr Bld: 6.2 % (ref 4.6–6.5)

## 2023-02-12 NOTE — Assessment & Plan Note (Signed)
Monitor LFTs 

## 2023-02-12 NOTE — Assessment & Plan Note (Signed)
Monitor labs 

## 2023-02-12 NOTE — Assessment & Plan Note (Signed)
Cont Micardis 40 mg/d and Norvasc 5 mg/d

## 2023-02-12 NOTE — Assessment & Plan Note (Signed)
Cont on Flonase, Zyrtec

## 2023-02-12 NOTE — Assessment & Plan Note (Signed)
Calcium CT score is 5 On Fish oil

## 2023-02-12 NOTE — Progress Notes (Signed)
Subjective:  Patient ID: Dana Long, female    DOB: [birth year]  Age: 72 y.o. MRN: [id number]  CC: Follow-up (6 mnth f/u)   HPI Dana Long presents for HTN,mild CAD, allergies  Outpatient Medications Prior to Visit  Medication Sig Dispense Refill   amLODipine (NORVASC) 5 MG tablet Take 1 tablet (5 mg total) by mouth daily. 90 tablet 3   Calcium-Phosphorus-Vitamin D (CITRACAL +D3 PO) Take 1 each by mouth daily. Reported on 02/21/2016     fluticasone (FLONASE) 50 MCG/ACT nasal spray SPRAY 2 SPRAYS INTO EACH NOSTRIL EVERY DAY 48 mL 2   ibuprofen (ADVIL) 600 MG tablet TAKE 1 TABLET TWICE A DAY FOR 1 WEEK THEN AS NEEDED FOR PAIN 60 tablet 2   ipratropium (ATROVENT) 0.06 % nasal spray USE 2 SPRAYS IN EACH NOSTRIL 3 TIMES A DAY 45 mL 2   mupirocin ointment (BACTROBAN) 2 % On leg wound w/dressing change qd or bid 30 g 0   telmisartan (MICARDIS) 40 MG tablet TAKE 1 TABLET BY MOUTH EVERY DAY 90 tablet 2   No facility-administered medications prior to visit.    ROS: Review of Systems  Constitutional:  Negative for activity change, appetite change, chills, fatigue and unexpected weight change.  HENT:  Negative for congestion, mouth sores and sinus pressure.   Eyes:  Negative for visual disturbance.  Respiratory:  Negative for cough and chest tightness.   Gastrointestinal:  Negative for abdominal pain and nausea.  Genitourinary:  Negative for difficulty urinating, frequency and vaginal pain.  Musculoskeletal:  Negative for back pain and gait problem.  Skin:  Negative for pallor and rash.  Neurological:  Negative for dizziness, tremors, weakness, numbness and headaches.  Psychiatric/Behavioral:  Negative for confusion and sleep disturbance.     Objective:  BP 120/80 (BP Location: Left Arm, Patient Position: Sitting, Cuff Size: Large)   Pulse 82   Temp 98.3 F (36.8 C) (Oral)   Ht 5\' 3"  (1.6 m)   Wt 147 lb (66.7 kg)   SpO2 98%   BMI 26.04 kg/m   BP Readings from Last 3  Encounters:  02/12/23 120/80  08/14/22 (!) 148/82  08/10/21 138/90    Wt Readings from Last 3 Encounters:  02/12/23 147 lb (66.7 kg)  11/08/22 145 lb (65.8 kg)  08/14/22 148 lb 6.4 oz (67.3 kg)    Physical Exam Constitutional:      General: She is not in acute distress.    Appearance: Normal appearance. She is well-developed.  HENT:     Head: Normocephalic.     Right Ear: External ear normal.     Left Ear: External ear normal.     Nose: Nose normal.  Eyes:     General:        Right eye: No discharge.        Left eye: No discharge.     Conjunctiva/sclera: Conjunctivae normal.     Pupils: Pupils are equal, round, and reactive to light.  Neck:     Thyroid: No thyromegaly.     Vascular: No JVD.     Trachea: No tracheal deviation.  Cardiovascular:     Rate and Rhythm: Normal rate and regular rhythm.     Heart sounds: Normal heart sounds.  Pulmonary:     Effort: No respiratory distress.     Breath sounds: No stridor. No wheezing.  Abdominal:     General: Bowel sounds are normal. There is no distension.     Palpations: Abdomen is soft. There  is no mass.     Tenderness: There is no abdominal tenderness. There is no guarding or rebound.  Musculoskeletal:        General: No tenderness.     Cervical back: Normal range of motion and neck supple. No rigidity.  Lymphadenopathy:     Cervical: No cervical adenopathy.  Skin:    Findings: No erythema or rash.  Neurological:     Cranial Nerves: No cranial nerve deficit.     Motor: No abnormal muscle tone.     Coordination: Coordination normal.     Deep Tendon Reflexes: Reflexes normal.  Psychiatric:        Behavior: Behavior normal.        Thought Content: Thought content normal.        Judgment: Judgment normal.     Lab Results  Component Value Date   WBC 4.8 08/14/2022   HGB 13.6 08/14/2022   HCT 42.3 08/14/2022   PLT 276.0 08/14/2022   GLUCOSE 100 (H) 08/14/2022   CHOL 198 08/14/2022   TRIG 112.0 08/14/2022   HDL  48.20 08/14/2022   LDLCALC 127 (H) 08/14/2022   ALT 24 08/14/2022   AST 25 08/14/2022   NA 137 08/14/2022   K 3.9 08/14/2022   CL 100 08/14/2022   CREATININE 0.75 08/14/2022   BUN 10 08/14/2022   CO2 29 08/14/2022   TSH 0.66 08/14/2022   INR 1.12 03/21/2015    CT CARDIAC SCORING ([facility] LOCATIONS ONLY)  Result Date: 09/27/2022 CLINICAL DATA:  72 year old black female * Tracking Code: [place] * EXAM: CT CARDIAC CORONARY ARTERY CALCIUM SCORE TECHNIQUE: Non-contrast imaging through the heart was performed using prospective ECG gating. Image post processing was performed on an independent workstation, allowing for quantitative analysis of the heart and coronary arteries. Note that this exam targets the heart and the chest was not imaged in its entirety. COMPARISON:  None available. FINDINGS: CORONARY CALCIUM SCORES: Left Main: 0 LAD: 5 LCx: 0 RCA: 0 Total Agatston Score: 0 MESA database percentile: 54 AORTA MEASUREMENTS: Ascending Aorta: 3.0 cm Descending Aorta:2.2 cm OTHER FINDINGS: Vascular: Normal heart size. Mitral annular calcifications. No pericardial effusion. Normal caliber thoracic aorta with mild scattered calcified plaque. Mediastinum/Nodes: Small hiatal hernia. No pathologically enlarged lymph nodes seen in the chest. Lungs/Pleura: Central airways are patent. No consolidation, pleural effusion or pneumothorax. Upper Abdomen: No acute abnormality. Musculoskeletal: No chest wall mass or suspicious bone lesions identified. IMPRESSION: 1. Total calcium score of 5 is at percentile 54 for subjects of the same age, gender, and race/ethnicity. 2. Aortic Atherosclerosis (ICD10-I70.0). Electronically Signed   By: Yetta Glassman M.D.   On: 09/27/2022 14:01    Assessment & Plan:   Problem List Items Addressed This Visit       Cardiovascular and Mediastinum   Essential hypertension     Cont Micardis 40 mg/d and Norvasc 5 mg/d      Coronary atherosclerosis    Calcium CT score is 5 On Fish oil         Respiratory   Allergic rhinitis, seasonal    Cont on Flonase, Zyrtec        Digestive   Liver fibrosis - Primary    Monitor LFTs      Relevant Orders   Comprehensive metabolic panel   Lipid panel   Hemoglobin A1c     Other   Abnormal TSH    Monitor labs      Relevant Orders   Comprehensive metabolic panel  Lipid panel   Other Visit Diagnoses     Hyperglycemia       Relevant Orders   Hemoglobin A1c         No orders of the defined types were placed in this encounter.     Follow-up: Return in about 6 months (around 08/15/2023) for Wellness Exam.  Walker Kehr, MD

## 2023-02-12 NOTE — Patient Instructions (Signed)
ear Dana Long, Your coronary calcium CT score is 5.  It is consistent with a minimal degree of coronary atherosclerosis.  He also have a mild atherosclerosis (hardening) over the aorta.  Your current management should include fish oil.  Your other chest organs look normal. Sincerely, AP   CAD stands for Coronary Artery Disease   Calcium Score Presence of CAD   0                     No evidence of CAD 1-10                     Minimal evidence of CAD 11-100                     Mild evidence of CAD 101-400         Moderate evidence of CAD Over 400         Extensive evidence of CAD     Coronary artery calcium (CAC) score is a strong predictor of incident coronary heart disease (CHD) and provides predictive information beyond traditional risk factors. CAC scoring is reasonable to use in the decision to withhold, postpone, or initiate statin therapy in intermediate-risk or selected borderline-risk asymptomatic adults (age 77-75 years and LDL-C >=70 to <190 mg/dL) who do not have diabetes or established atherosclerotic cardiovascular disease (ASCVD).* In intermediate-risk (10-year ASCVD risk >=7.5% to <20%) adults or selected borderline-risk (10-year ASCVD risk >=5% to <7.5%) adults in whom a CAC score is measured for the purpose of making a treatment decision the following recommendations have been made:   If CAC=0, it is reasonable to withhold statin therapy and reassess in 5 to 10 years, as long as higher risk conditions are absent (diabetes mellitus, family history of premature CHD in first degree relatives (males <55 years; females <65 years), cigarette smoking, or LDL >=190 mg/dL).   If CAC is 1 to 99, it is reasonable to initiate statin therapy for patients >=11 years of age.   If CAC is >=100 or >=75th percentile, it is reasonable to initiate statin therapy at any age.   Cardiology referral should be considered for patients with CAC scores >=400 or >=75th percentile.    *2018 AHA/ACC/AACVPR/AAPA/ABC/ACPM/ADA/AGS/APhA/ASPC/NLA/PCNA Guideline on the Management of Blood Cholesterol: A Report of the American College of Cardiology/American Heart Association Task Force on Clinical Practice Guidelines. J Am Coll Cardiol. 2019;73(24):3168-3209.

## 2023-03-23 ENCOUNTER — Other Ambulatory Visit: Payer: Self-pay | Admitting: Internal Medicine

## 2023-04-04 ENCOUNTER — Telehealth: Payer: Self-pay | Admitting: Internal Medicine

## 2023-04-04 NOTE — Telephone Encounter (Signed)
Contacted Margie Kasik to schedule their annual wellness visit. Appointment made for 04/18/2023.  [facility] Care Guide [facility] AWV TEAM Direct Dial: [phone]

## 2023-04-15 DIAGNOSIS — N958 Other specified menopausal and perimenopausal disorders: Secondary | ICD-10-CM | POA: Diagnosis not present

## 2023-04-15 LAB — HM DEXA SCAN: HM Dexa Scan: NORMAL

## 2023-04-17 NOTE — Patient Instructions (Signed)

## 2023-04-17 NOTE — Progress Notes (Unsigned)
I connected with  Dana Long on 04/18/2023 by a audio enabled telemedicine application and verified that I am speaking with the correct person using two identifiers.  Patient Location: Home  Provider Location: Home Office  I discussed the limitations of evaluation and management by telemedicine. The patient expressed understanding and agreed to proceed.  Subjective:   Dana Long is a 72 y.o. female who presents for Medicare Annual (Subsequent) preventive examination.  Review of Systems    Per HPI unless specifically indicated below.  Cardiac Risk Factors include: advanced age (>2men, >57 women);female gender,Essential Hypertension           Objective:       02/12/2023    8:34 AM 11/08/2022    8:46 AM 08/14/2022    8:17 AM  Vitals with BMI  Height 5\' 3"  5\' 3"  5\' 3"   Weight 147 lbs 145 lbs 148 lbs 6 oz  BMI 26.05 25.69 26.29  Systolic 120  148  Diastolic 80  82  Pulse 82  78    Today's Vitals   04/18/23 0922  PainSc: 2    There is no height or weight on file to calculate BMI.     04/18/2023    9:29 AM 11/08/2022    8:49 AM 11/07/2021    8:49 AM 09/14/2020    8:53 AM 07/15/2019    9:35 AM 08/31/2018   11:34 AM 02/21/2016    3:16 PM  Advanced Directives  Does Patient Have a Medical Advance Directive? No No No No No No No  Would patient like information on creating a medical advance directive? No - Patient declined Yes (MAU/Ambulatory/Procedural Areas - Information given) No - Patient declined Yes (MAU/Ambulatory/Procedural Areas - Information given) No - Patient declined  No - patient declined information    Current Medications (verified) Outpatient Encounter Medications as of 04/18/2023  Medication Sig   amLODipine (NORVASC) 5 MG tablet Take 1 tablet (5 mg total) by mouth daily.   Calcium-Phosphorus-Vitamin D (CITRACAL +D3 PO) Take 1 each by mouth daily. Reported on 02/21/2016   fluticasone (FLONASE) 50 MCG/ACT nasal spray SPRAY 2 SPRAYS INTO EACH  NOSTRIL EVERY DAY   ibuprofen (ADVIL) 600 MG tablet TAKE 1 TABLET TWICE A DAY FOR 1 WEEK THEN AS NEEDED FOR PAIN   ipratropium (ATROVENT) 0.06 % nasal spray USE 2 SPRAYS IN EACH NOSTRIL 3 TIMES A DAY   telmisartan (MICARDIS) 40 MG tablet TAKE 1 TABLET BY MOUTH EVERY DAY   mupirocin ointment (BACTROBAN) 2 % On leg wound w/dressing change qd or bid (Patient not taking: Reported on 04/18/2023)   No facility-administered encounter medications on file as of 04/18/2023.    Allergies (verified) Maxzide [hydrochlorothiazide w-triamterene]   History: Past Medical History:  Diagnosis Date   GERD (gastroesophageal reflux disease)    Gout    ?   Hypertension    Past Surgical History:  Procedure Laterality Date   TUBAL LIGATION     Family History  Problem Relation Age of Onset   Gout Father    Diabetes Mother    Diabetes Sister    Diabetes Brother    Diabetes Other    Hypertension Other    Colon cancer Neg Hx    Social History   Socioeconomic History   Marital status: Widowed    Spouse name: Not on file   Number of children: 1   Years of education: Not on file   Highest education level: Not on file  Occupational  History   Occupation: Nursing  Tobacco Use   Smoking status: Former   Smokeless tobacco: Never  Building services engineer Use: Never used  Substance and Sexual Activity   Alcohol use: No    Alcohol/week: 0.0 standard drinks of alcohol   Drug use: No   Sexual activity: Yes  Other Topics Concern   Not on file  Social History Narrative   Not on file   Social Determinants of Health   Financial Resource Strain: Low Risk  (04/18/2023)   Overall Financial Resource Strain (CARDIA)    Difficulty of Paying Living Expenses: Not hard at all  Food Insecurity: No Food Insecurity (04/18/2023)   Hunger Vital Sign    Worried About Running Out of Food in the Last Year: Never true    Ran Out of Food in the Last Year: Never true  Transportation Needs: No Transportation Needs  (04/18/2023)   PRAPARE - Administrator, Civil Service (Medical): No    Lack of Transportation (Non-Medical): No  Physical Activity: Insufficiently Active (04/18/2023)   Exercise Vital Sign    Days of Exercise per Week: 3 days    Minutes of Exercise per Session: 30 min  Stress: No Stress Concern Present (04/18/2023)   [employer] of Occupational Health - Occupational Stress Questionnaire    Feeling of Stress : Not at all  Social Connections: Moderately Integrated (04/18/2023)   Social Connection and Isolation Panel [NHANES]    Frequency of Communication with Friends and Family: More than three times a week    Frequency of Social Gatherings with Friends and Family: Twice a week    Attends Religious Services: More than 4 times per year    Active Member of [employer] or Organizations: Yes    Attends Banker Meetings: 1 to 4 times per year    Marital Status: Widowed    Tobacco Counseling Counseling given: No   Clinical Intake:  Pre-visit preparation completed: No  Pain : 0-10 Pain Score: 2  Pain Type: Chronic pain Pain Location: Back Pain Orientation: Lower Pain Descriptors / Indicators: Aching Pain Onset: More than a month ago Pain Frequency: Intermittent     Nutritional Status: BMI of 19-24  Normal Nutritional Risks: None Diabetes: No  How often do you need to have someone help you when you read instructions, pamphlets, or other written materials from your doctor or pharmacy?: 1 - Never  Diabetic?No  Interpreter Needed?: No  Information entered by :: Laurel Dimmer, CMA   Activities of Daily Living    04/18/2023    9:21 AM 11/08/2022    8:54 AM  In your present state of health, do you have any difficulty performing the following activities:  Hearing? 0 0  Vision? 1 0  Comment Wear glasses, [facility]   Difficulty concentrating or making decisions? 0 0  Walking or climbing stairs? 0 0  Dressing or bathing? 0 0  Doing errands,  shopping? 0 0  Preparing Food and eating ?  N  Using the Toilet?  N  In the past six months, have you accidently leaked urine?  N  Do you have problems with loss of bowel control?  N  Managing your Medications?  N  Managing your Finances?  N  Housekeeping or managing your Housekeeping?  N    Patient Care Team: Plotnikov, Georgina Quint, MD as PCP - General (Internal Medicine) Gennie Alma, CNM (Inactive) as Midwife (Obstetrics and Gynecology) [facility] as  Consulting Physician (Optometry) Ginette Otto, [facility] as Consulting Physician (Obstetrics and Gynecology)  Indicate any recent Medical Services you may have received from other than [facility] providers in the past year (date may be approximate).     Assessment:   This is a routine wellness examination for Dana Long.   Hearing/Vision screen Denies any hearing issues. Denies any change to her vision. Wear glasses. Annual Eye Exam.   Dietary issues and exercise activities discussed: Current Exercise Habits: Home exercise routine, Type of exercise: walking, Time (Minutes): 30, Frequency (Times/Week): 3, Weekly Exercise (Minutes/Week): 90, Intensity: Moderate, Exercise limited by: None identified   Goals Addressed             This Visit's Progress    Stay Active and Independent         Why is this important?   Activity helps to keep your muscles strong.  You will sleep better and feel more relaxed.  You will have more energy and feel less stressed.  If you are not active now, start slowly. Little changes make a big difference.  Rest, but not too much.  Stay as active as you can and listen to your body's signals.            Depression Screen    04/18/2023    9:20 AM 02/12/2023    8:35 AM 11/08/2022    8:57 AM 11/07/2021    8:53 AM 08/10/2021   11:22 AM 09/14/2020    8:51 AM 05/27/2019    9:11 AM  PHQ 2/9 Scores  PHQ - 2 Score 0 0 0 0 0 0 0  PHQ- 9 Score     0      Fall Risk    04/18/2023     9:21 AM 02/12/2023    8:35 AM 11/08/2022    8:51 AM 11/07/2021    8:51 AM 08/10/2021   11:22 AM  Fall Risk   Falls in the past year? 0 0 0 0 0  Number falls in past yr: 0 0 0 0 0  Injury with Fall? 0 0 0 0 0  Risk for fall due to : No Fall Risks No Fall Risks No Fall Risks No Fall Risks No Fall Risks  Follow up Falls evaluation completed Falls evaluation completed Falls prevention discussed Falls prevention discussed     FALL RISK PREVENTION PERTAINING TO THE HOME:  Any stairs in or around the home? No  If so, are there any without handrails? No  Home free of loose throw rugs in walkways, pet beds, electrical cords, etc? Yes  Adequate lighting in your home to reduce risk of falls? Yes   ASSISTIVE DEVICES UTILIZED TO PREVENT FALLS:  Life alert? No  Use of a cane, walker or w/c? No  Grab bars in the bathroom? Yes  Shower chair or bench in shower? No  Elevated toilet seat or a handicapped toilet? No   TIMED UP AND GO:  Was the test performed?Unable to perform, virtual appointment   Cognitive Function:        04/18/2023    9:26 AM 11/08/2022    8:54 AM 09/14/2020    8:55 AM  6CIT Screen  What Year? 0 points 0 points 0 points  What month? 0 points 0 points 0 points  What time? 0 points 0 points 0 points  Count back from 20 0 points 0 points 0 points  Months in reverse 0 points 0 points 0 points  Repeat phrase 4  points 0 points 0 points  Total Score 4 points 0 points 0 points    Immunizations Immunization History  Administered Date(s) Administered   Fluad Quad(high Dose 65+) 07/29/2019, 08/10/2021, 08/14/2022   Hepatitis B, ADULT 04/04/2015, 05/02/2015, 03/20/2016   Influenza Split 09/06/2016, 07/24/2017   Influenza,inj,Quad PF,6+ Mos 11/11/2013, 11/16/2014, 08/26/2015   Influenza-Unspecified 07/17/2018   PFIZER(Purple Top)SARS-COV-2 Vaccination 12/18/2019, 01/08/2020, 09/17/2020   PNEUMOCOCCAL CONJUGATE-20 11/15/2021   Pneumococcal Conjugate-13 07/11/2018    Tdap 11/14/2012, 12/22/2017   Zoster, Live 06/16/2013    TDAP status: Up to date  Flu Vaccine status: Up to date  Pneumococcal vaccine status: Up to date  Covid-19 vaccine status: Information provided on how to obtain vaccines.   Qualifies for Shingles Vaccine? Yes   Zostavax completed Yes   Shingrix Completed?: Yes  Screening Tests Health Maintenance  Topic Date Due   COVID-19 Vaccine (4 - 2023-24 season) 07/27/2022   INFLUENZA VACCINE  06/27/2023   Medicare Annual Wellness (AWV)  04/17/2024   MAMMOGRAM  08/02/2024   COLONOSCOPY (Pts 45-83yrs Insurance coverage will need to be confirmed)  03/06/2026   DTaP/Tdap/Td (3 - Td or Tdap) 12/23/2027   Pneumonia Vaccine 36+ Years old  Completed   DEXA SCAN  Completed   Hepatitis C Screening  Completed   HPV VACCINES  Aged Out   Zoster Vaccines- Shingrix  Discontinued    Health Maintenance  Health Maintenance Due  Topic Date Due   COVID-19 Vaccine (4 - 2023-24 season) 07/27/2022    Colorectal cancer screening: Type of screening: Colonoscopy. Completed 03/06/2016. Repeat every 10 years  Mammogram status: Completed 08/02/2022. Repeat every year  DEXA Scan:  Lung Cancer Screening: (Low Dose CT Chest recommended if Age 36-80 years, 30 pack-year currently smoking OR have quit w/in 15years.) does not qualify.   Lung Cancer Screening Referral: not appicable   Additional Screening:  Hepatitis C Screening: does qualify; Completed 04/13/2020  Vision Screening: Recommended annual ophthalmology exams for early detection of glaucoma and other disorders of the eye. Is the patient up to date with their annual eye exam?  Yes  Who is the provider or what is the name of the office in which the patient attends annual eye exams? [facility]  If pt is not established with a provider, would they like to be referred to a provider to establish care? No .   Dental Screening: Recommended annual dental exams for proper oral hygiene  Community  Resource Referral / Chronic Care Management: CRR required this visit?  No   CCM required this visit?  No      Plan:     I have personally reviewed and noted the following in the patient's chart:   Medical and social history Use of alcohol, tobacco or illicit drugs  Current medications and supplements including opioid prescriptions. Patient is not currently taking opioid prescriptions. Functional ability and status Nutritional status Physical activity Advanced directives List of other physicians Hospitalizations, surgeries, and ER visits in previous 12 months Vitals Screenings to include cognitive, depression, and falls Referrals and appointments  In addition, I have reviewed and discussed with patient certain preventive protocols, quality metrics, and best practice recommendations. A written personalized care plan for preventive services as well as general preventive health recommendations were provided to patient.     Dana Long , Thank you for taking time to come for your Medicare Wellness Visit. I appreciate your ongoing commitment to your health goals. Please review the following plan we discussed and let me  know if I can assist you in the future.   These are the goals we discussed:  Goals       Patient Stated (pt-stated)      "Catch me if you can"  that's my motto.  I will continue to stay physically, socially and mentally active.      Stay Active and Independent        Why is this important?   Activity helps to keep your muscles strong.  You will sleep better and feel more relaxed.  You will have more energy and feel less stressed.  If you are not active now, start slowly. Little changes make a big difference.  Rest, but not too much.  Stay as active as you can and listen to your body's signals.             This is a list of the screening recommended for you and due dates:  Health Maintenance  Topic Date Due   COVID-19 Vaccine (4 - 2023-24 season)  07/27/2022   Flu Shot  06/27/2023   Medicare Annual Wellness Visit  04/17/2024   Mammogram  08/02/2024   Colon Cancer Screening  03/06/2026   DTaP/Tdap/Td vaccine (3 - Td or Tdap) 12/23/2027   Pneumonia Vaccine  Completed   DEXA scan (bone density measurement)  Completed   Hepatitis C Screening: USPSTF Recommendation to screen - Ages 33-79 yo.  Completed   HPV Vaccine  Aged Out   Zoster (Shingles) Vaccine  Discontinued    Lonna Cobb, [facility]   04/18/2023   Nurse Notes: Approximately 30 minute Non-Face -To-Face Medicare Wellness Visit

## 2023-04-18 ENCOUNTER — Ambulatory Visit (INDEPENDENT_AMBULATORY_CARE_PROVIDER_SITE_OTHER): Payer: Medicare HMO

## 2023-04-18 DIAGNOSIS — Z0000 Encounter for general adult medical examination without abnormal findings: Secondary | ICD-10-CM

## 2023-04-20 ENCOUNTER — Other Ambulatory Visit: Payer: Self-pay | Admitting: Internal Medicine

## 2023-04-28 DIAGNOSIS — H524 Presbyopia: Secondary | ICD-10-CM | POA: Diagnosis not present

## 2023-05-11 ENCOUNTER — Other Ambulatory Visit: Payer: Self-pay | Admitting: Internal Medicine

## 2023-07-14 DIAGNOSIS — Z0100 Encounter for examination of eyes and vision without abnormal findings: Secondary | ICD-10-CM | POA: Diagnosis not present

## 2023-08-03 ENCOUNTER — Other Ambulatory Visit: Payer: Self-pay | Admitting: Internal Medicine

## 2023-08-05 DIAGNOSIS — N760 Acute vaginitis: Secondary | ICD-10-CM | POA: Diagnosis not present

## 2023-08-05 DIAGNOSIS — Z6828 Body mass index (BMI) 28.0-28.9, adult: Secondary | ICD-10-CM | POA: Diagnosis not present

## 2023-08-05 DIAGNOSIS — Z01419 Encounter for gynecological examination (general) (routine) without abnormal findings: Secondary | ICD-10-CM | POA: Diagnosis not present

## 2023-08-05 DIAGNOSIS — Z1231 Encounter for screening mammogram for malignant neoplasm of breast: Secondary | ICD-10-CM | POA: Diagnosis not present

## 2023-08-05 LAB — HM MAMMOGRAPHY

## 2023-08-19 ENCOUNTER — Ambulatory Visit (INDEPENDENT_AMBULATORY_CARE_PROVIDER_SITE_OTHER): Payer: Medicare HMO | Admitting: Internal Medicine

## 2023-08-19 ENCOUNTER — Encounter: Payer: Self-pay | Admitting: Internal Medicine

## 2023-08-19 VITALS — BP 122/80 | HR 87 | Temp 98.6°F | Ht 63.0 in | Wt 147.0 lb

## 2023-08-19 DIAGNOSIS — R739 Hyperglycemia, unspecified: Secondary | ICD-10-CM | POA: Diagnosis not present

## 2023-08-19 DIAGNOSIS — I2510 Atherosclerotic heart disease of native coronary artery without angina pectoris: Secondary | ICD-10-CM | POA: Diagnosis not present

## 2023-08-19 DIAGNOSIS — I10 Essential (primary) hypertension: Secondary | ICD-10-CM

## 2023-08-19 DIAGNOSIS — I2583 Coronary atherosclerosis due to lipid rich plaque: Secondary | ICD-10-CM | POA: Diagnosis not present

## 2023-08-19 DIAGNOSIS — Z23 Encounter for immunization: Secondary | ICD-10-CM | POA: Diagnosis not present

## 2023-08-19 DIAGNOSIS — Z0000 Encounter for general adult medical examination without abnormal findings: Secondary | ICD-10-CM | POA: Diagnosis not present

## 2023-08-19 LAB — TSH: TSH: 0.28 u[IU]/mL — ABNORMAL LOW (ref 0.35–5.50)

## 2023-08-19 LAB — COMPREHENSIVE METABOLIC PANEL
ALT: 21 U/L (ref 0–35)
AST: 25 U/L (ref 0–37)
Albumin: 4.3 g/dL (ref 3.5–5.2)
Alkaline Phosphatase: 42 U/L (ref 39–117)
BUN: 11 mg/dL (ref 6–23)
CO2: 26 mEq/L (ref 19–32)
Calcium: 9.9 mg/dL (ref 8.4–10.5)
Chloride: 104 mEq/L (ref 96–112)
Creatinine, Ser: 0.81 mg/dL (ref 0.40–1.20)
GFR: 73.16 mL/min (ref >60.00)
Glucose, Bld: 104 mg/dL — ABNORMAL HIGH (ref 70–99)
Potassium: 3.8 mEq/L (ref 3.5–5.1)
Sodium: 138 mEq/L (ref 135–145)
Total Bilirubin: 0.5 mg/dL (ref 0.2–1.2)
Total Protein: 7.3 g/dL (ref 6.0–8.3)

## 2023-08-19 LAB — CBC WITH DIFFERENTIAL/PLATELET
Basophils Absolute: 0 10*3/uL (ref 0.0–0.1)
Basophils Relative: 0.7 % (ref 0.0–3.0)
Eosinophils Absolute: 0.3 10*3/uL (ref 0.0–0.7)
Eosinophils Relative: 8.1 % — ABNORMAL HIGH (ref 0.0–5.0)
HCT: 40.3 % (ref 36.0–46.0)
Hemoglobin: 12.9 g/dL (ref 12.0–15.0)
Lymphocytes Relative: 43.4 % (ref 12.0–46.0)
Lymphs Abs: 1.8 10*3/uL (ref 0.7–4.0)
MCHC: 32.1 g/dL (ref 30.0–36.0)
MCV: 83.6 fl (ref 78.0–100.0)
Monocytes Absolute: 0.3 10*3/uL (ref 0.1–1.0)
Monocytes Relative: 6.9 % (ref 3.0–12.0)
Neutro Abs: 1.7 10*3/uL (ref 1.4–7.7)
Neutrophils Relative %: 40.9 % — ABNORMAL LOW (ref 43.0–77.0)
Platelets: 264 10*3/uL (ref 150.0–400.0)
RBC: 4.83 Mil/uL (ref 3.87–5.11)
RDW: 13.4 % (ref 11.5–15.5)
WBC: 4.2 10*3/uL (ref 4.0–10.5)

## 2023-08-19 LAB — MICROALBUMIN / CREATININE URINE RATIO
Creatinine,U: 34.8 mg/dL
Microalb Creat Ratio: 2 mg/g (ref 0.0–30.0)
Microalb, Ur: <0.7 mg/dL (ref 0.0–1.9)

## 2023-08-19 LAB — LIPID PANEL
Cholesterol: 194 mg/dL (ref 0–200)
HDL: 51.7 mg/dL (ref >39.00)
LDL Cholesterol: 127 mg/dL — ABNORMAL HIGH (ref 0–99)
NonHDL: 142.34
Total CHOL/HDL Ratio: 4
Triglycerides: 79 mg/dL (ref 0.0–149.0)
VLDL: 15.8 mg/dL (ref 0.0–40.0)

## 2023-08-19 LAB — URINALYSIS
Bilirubin Urine: NEGATIVE
Hgb urine dipstick: NEGATIVE
Ketones, ur: NEGATIVE
Leukocytes,Ua: NEGATIVE
Nitrite: NEGATIVE
Specific Gravity, Urine: <=1.005 — AB (ref 1.000–1.030)
Total Protein, Urine: NEGATIVE
Urine Glucose: NEGATIVE
Urobilinogen, UA: 0.2 (ref 0.0–1.0)
pH: 6 (ref 5.0–8.0)

## 2023-08-19 LAB — HEMOGLOBIN A1C: Hgb A1c MFr Bld: 6.1 % (ref 4.6–6.5)

## 2023-08-19 MED ORDER — IBUPROFEN 600 MG PO TABS: ORAL_TABLET | ORAL | 2 refills | Status: DC

## 2023-08-19 NOTE — Assessment & Plan Note (Signed)
  We discussed age appropriate health related issues, including available/recomended screening tests and vaccinations. Labs were ordered to be later reviewed . All questions were answered. We discussed one or more of the following - seat belt use, use of sunscreen/sun exposure exercise, fall risk reduction, second hand smoke exposure, firearm use and storage, seat belt use, a need for adhering to healthy diet and exercise. Labs were ordered.  All questions were answered. Due PAP, mammo Ophth exam q 1 year Colon due in 2027 Dr Russella Dar Coronary calcium score chest CT 2023 - B had MI

## 2023-08-19 NOTE — Assessment & Plan Note (Signed)
Calcium CT score is 5 On Fish oil

## 2023-08-19 NOTE — Progress Notes (Signed)
Subjective:  Patient ID: Dana Long, female    DOB: 01-Aug-1951  Age: 72 y.o. MRN: [id number]  CC: Annual Exam   HPI Dana Long presents for a well exam  Outpatient Medications Prior to Visit  Medication Sig Dispense Refill   amLODipine (NORVASC) 5 MG tablet TAKE 1 TABLET (5 MG TOTAL) BY MOUTH DAILY. 90 tablet 3   Calcium-Phosphorus-Vitamin D (CITRACAL +D3 PO) Take 1 each by mouth daily. Reported on 02/21/2016     fluticasone (FLONASE) 50 MCG/ACT nasal spray SPRAY 2 SPRAYS INTO EACH NOSTRIL EVERY DAY 48 mL 2   ibuprofen (ADVIL) 600 MG tablet TAKE 1 TABLET TWICE A DAY FOR 1 WEEK THEN AS NEEDED FOR PAIN 60 tablet 2   ipratropium (ATROVENT) 0.06 % nasal spray USE 2 SPRAYS IN EACH NOSTRIL 3 TIMES A DAY 15 mL 5   telmisartan (MICARDIS) 40 MG tablet Take 1 tablet (40 mg total) by mouth daily. Annual appt due in Sept must see provider for future refills 90 tablet 0   mupirocin ointment (BACTROBAN) 2 % On leg wound w/dressing change qd or bid (Patient not taking: Reported on 04/18/2023) 30 g 0   No facility-administered medications prior to visit.    ROS: Review of Systems  Constitutional:  Negative for activity change, appetite change, chills, fatigue and unexpected weight change.  HENT:  Negative for congestion, mouth sores and sinus pressure.   Eyes:  Negative for visual disturbance.  Respiratory:  Negative for cough and chest tightness.   Gastrointestinal:  Negative for abdominal pain and nausea.  Genitourinary:  Negative for difficulty urinating, frequency and vaginal pain.  Musculoskeletal:  Positive for arthralgias and back pain. Negative for gait problem.  Skin:  Negative for pallor and rash.  Neurological:  Negative for dizziness, tremors, weakness, numbness and headaches.  Psychiatric/Behavioral:  Negative for confusion and sleep disturbance.     Objective:  BP 122/80 (BP Location: Left Arm, Patient Position: Sitting, Cuff Size: Large)   Pulse 87   Temp 98.6 F (37 C)  (Oral)   Ht 5\' 3"  (1.6 m)   Wt 147 lb (66.7 kg)   SpO2 97%   BMI 26.04 kg/m   BP Readings from Last 3 Encounters:  08/19/23 122/80  02/12/23 120/80  08/14/22 (!) 148/82    Wt Readings from Last 3 Encounters:  08/19/23 147 lb (66.7 kg)  02/12/23 147 lb (66.7 kg)  11/08/22 145 lb (65.8 kg)    Physical Exam Constitutional:      General: She is not in acute distress.    Appearance: Normal appearance. She is well-developed.  HENT:     Head: Normocephalic.     Right Ear: External ear normal.     Left Ear: External ear normal.     Nose: Nose normal.  Eyes:     General:        Right eye: No discharge.        Left eye: No discharge.     Conjunctiva/sclera: Conjunctivae normal.     Pupils: Pupils are equal, round, and reactive to light.  Neck:     Thyroid: No thyromegaly.     Vascular: No JVD.     Trachea: No tracheal deviation.  Cardiovascular:     Rate and Rhythm: Normal rate and regular rhythm.     Heart sounds: Normal heart sounds.  Pulmonary:     Effort: No respiratory distress.     Breath sounds: No stridor. No wheezing.  Abdominal:  General: Bowel sounds are normal. There is no distension.     Palpations: Abdomen is soft. There is no mass.     Tenderness: There is no abdominal tenderness. There is no guarding or rebound.  Musculoskeletal:        General: No tenderness.     Cervical back: Normal range of motion and neck supple. No rigidity.  Lymphadenopathy:     Cervical: No cervical adenopathy.  Skin:    Findings: No erythema or rash.  Neurological:     Cranial Nerves: No cranial nerve deficit.     Motor: No abnormal muscle tone.     Coordination: Coordination normal.     Deep Tendon Reflexes: Reflexes normal.  Psychiatric:        Behavior: Behavior normal.        Thought Content: Thought content normal.        Judgment: Judgment normal.   Mild wax B  Lab Results  Component Value Date   WBC 4.8 08/14/2022   HGB 13.6 08/14/2022   HCT 42.3  08/14/2022   PLT 276.0 08/14/2022   GLUCOSE 109 (H) 02/12/2023   CHOL 196 02/12/2023   TRIG 91.0 02/12/2023   HDL 53.80 02/12/2023   LDLCALC 124 (H) 02/12/2023   ALT 15 02/12/2023   AST 20 02/12/2023   NA 138 02/12/2023   K 3.7 02/12/2023   CL 99 02/12/2023   CREATININE 0.80 02/12/2023   BUN 11 02/12/2023   CO2 29 02/12/2023   TSH 0.66 08/14/2022   INR 1.12 03/21/2015   HGBA1C 6.2 02/12/2023    CT CARDIAC SCORING ([facility] LOCATIONS ONLY)  Result Date: 09/27/2022 CLINICAL DATA:  72 year old black female * Tracking Code: FCC * EXAM: CT CARDIAC CORONARY ARTERY CALCIUM SCORE TECHNIQUE: Non-contrast imaging through the heart was performed using prospective ECG gating. Image post processing was performed on an independent workstation, allowing for quantitative analysis of the heart and coronary arteries. Note that this exam targets the heart and the chest was not imaged in its entirety. COMPARISON:  None available. FINDINGS: CORONARY CALCIUM SCORES: Left Main: 0 LAD: 5 LCx: 0 RCA: 0 Total Agatston Score: 0 MESA database percentile: 54 AORTA MEASUREMENTS: Ascending Aorta: 3.0 cm Descending Aorta:2.2 cm OTHER FINDINGS: Vascular: Normal heart size. Mitral annular calcifications. No pericardial effusion. Normal caliber thoracic aorta with mild scattered calcified plaque. Mediastinum/Nodes: Small hiatal hernia. No pathologically enlarged lymph nodes seen in the chest. Lungs/Pleura: Central airways are patent. No consolidation, pleural effusion or pneumothorax. Upper Abdomen: No acute abnormality. Musculoskeletal: No chest wall mass or suspicious bone lesions identified. IMPRESSION: 1. Total calcium score of 5 is at percentile 54 for subjects of the same age, gender, and race/ethnicity. 2. Aortic Atherosclerosis (ICD10-I70.0). Electronically Signed   By: Allegra Lai M.D.   On: 09/27/2022 14:01    Assessment & Plan:   Problem List Items Addressed This Visit     Essential hypertension     Cont  Micardis 40 mg/d and Norvasc 5 mg/d      Well adult exam - Primary     We discussed age appropriate health related issues, including available/recomended screening tests and vaccinations. Labs were ordered to be later reviewed . All questions were answered. We discussed one or more of the following - seat belt use, use of sunscreen/sun exposure exercise, fall risk reduction, second hand smoke exposure, firearm use and storage, seat belt use, a need for adhering to healthy diet and exercise. Labs were ordered.  All questions were answered.  Due PAP, mammo Ophth exam q 1 year Colon due in 2027 Dr Russella Dar Coronary calcium score chest CT 2023 - B had MI      Relevant Orders   TSH   Urinalysis   CBC with Differential/Platelet   Lipid panel   Comprehensive metabolic panel   Hemoglobin A1c   Microalbumin / creatinine urine ratio   Coronary atherosclerosis    Calcium CT score is 5 On Fish oil      Other Visit Diagnoses     Need for influenza vaccination       Relevant Orders   Flu Vaccine Trivalent High Dose (Fluad) (Completed)   Hyperglycemia       Relevant Orders   Hemoglobin A1c   Microalbumin / creatinine urine ratio         No orders of the defined types were placed in this encounter.     Follow-up: Return in about 6 months (around 02/16/2024) for a follow-up visit.  Sonda Primes, MD

## 2023-08-19 NOTE — Assessment & Plan Note (Signed)
Cont Micardis 40 mg/d and Norvasc 5 mg/d

## 2023-10-04 ENCOUNTER — Other Ambulatory Visit: Payer: Self-pay | Admitting: Internal Medicine

## 2023-11-10 ENCOUNTER — Other Ambulatory Visit: Payer: Self-pay | Admitting: Internal Medicine

## 2023-11-21 ENCOUNTER — Other Ambulatory Visit: Payer: Self-pay | Admitting: Internal Medicine

## 2023-12-25 ENCOUNTER — Other Ambulatory Visit: Payer: Self-pay | Admitting: Internal Medicine

## 2023-12-29 ENCOUNTER — Other Ambulatory Visit: Payer: Self-pay | Admitting: Internal Medicine

## 2024-03-24 ENCOUNTER — Other Ambulatory Visit: Payer: Self-pay | Admitting: Internal Medicine

## 2024-04-21 ENCOUNTER — Ambulatory Visit

## 2024-04-21 VITALS — Ht 63.0 in | Wt 147.0 lb

## 2024-04-21 DIAGNOSIS — Z0000 Encounter for general adult medical examination without abnormal findings: Secondary | ICD-10-CM

## 2024-04-21 NOTE — Progress Notes (Addendum)
 Subjective:   Dana Long is a 73 y.o. who presents for a Medicare Wellness preventive visit.  As a reminder, Annual Wellness Visits don't include a physical exam, and some assessments may be limited, especially if this visit is performed virtually. We may recommend an in-person follow-up visit with your provider if needed.  Visit Complete: Virtual I connected with  Reily Belzer on 04/21/24 by a audio enabled telemedicine application and verified that I am speaking with the correct person using two identifiers.  Patient Location: Home  Provider Location: Home Office  I discussed the limitations of evaluation and management by telemedicine. The patient expressed understanding and agreed to proceed.  Vital Signs: Because this visit was a virtual/telehealth visit, some criteria may be missing or patient reported. Any vitals not documented were not able to be obtained and vitals that have been documented are patient reported.  VideoDeclined- This patient declined Librarian, academic. Therefore the visit was completed with audio only.  Persons Participating in Visit: Patient.  AWV Questionnaire: No: Patient Medicare AWV questionnaire was not completed prior to this visit.  Cardiac Risk Factors include: advanced age (>15men, >71 women);hypertension;Other (see comment), Risk factor comments: Coronary atherosclerosis, Liver fibrosis     Objective:     Today's Vitals   04/21/24 0810  Weight: 147 lb (66.7 kg)  Height: 5\' 3"  (1.6 m)   Body mass index is 26.04 kg/m.     04/21/2024    8:21 AM 04/18/2023    9:29 AM 11/08/2022    8:49 AM 11/07/2021    8:49 AM 09/14/2020    8:53 AM 07/15/2019    9:35 AM 08/31/2018   11:34 AM  Advanced Directives  Does Patient Have a Medical Advance Directive? Yes No No No No No No  Type of Estate agent of [place];Living will        Copy of Healthcare Power of Attorney in Chart? No - copy requested         Would patient like information on creating a medical advance directive?  No - Patient declined Yes (MAU/Ambulatory/Procedural Areas - Information given) No - Patient declined Yes (MAU/Ambulatory/Procedural Areas - Information given) No - Patient declined     Current Medications (verified) Outpatient Encounter Medications as of 04/21/2024  Medication Sig   amLODipine  (NORVASC ) 5 MG tablet TAKE 1 TABLET (5 MG TOTAL) BY MOUTH DAILY.   Calcium-Phosphorus-Vitamin D  (CITRACAL +D3 PO) Take 1 each by mouth daily. Reported on 02/21/2016   fluticasone  (FLONASE ) 50 MCG/ACT nasal spray SPRAY 2 SPRAYS INTO EACH NOSTRIL EVERY DAY   ipratropium (ATROVENT ) 0.06 % nasal spray USE 2 SPRAYS IN EACH NOSTRIL 3 TIMES A DAY   telmisartan  (MICARDIS ) 40 MG tablet TAKE 1 TABLET BY MOUTH DAILY. ANNUAL APPT DUE IN SEPT MUST SEE PROVIDER FOR FUTURE REFILLS   ibuprofen  (ADVIL ) 600 MG tablet TAKE 1 TABLET TWICE A DAY FOR 1 WEEK THEN AS NEEDED FOR PAIN (Patient not taking: Reported on 04/21/2024)   mupirocin  ointment (BACTROBAN ) 2 % On leg wound w/dressing change qd or bid (Patient not taking: Reported on 04/21/2024)   No facility-administered encounter medications on file as of 04/21/2024.    Allergies (verified) Maxzide [hydrochlorothiazide-triamterene ]   History: Past Medical History:  Diagnosis Date   GERD (gastroesophageal reflux disease)    Gout    ?   Hypertension    Past Surgical History:  Procedure Laterality Date   TUBAL LIGATION     Family History  Problem Relation  Age of Onset   Gout Father    Diabetes Mother    Diabetes Sister    Diabetes Brother    Diabetes Other    Hypertension Other    Colon cancer Neg Hx    Social History   Socioeconomic History   Marital status: Widowed    Spouse name: Not on file   Number of children: 1   Years of education: Not on file   Highest education level: Not on file  Occupational History   Occupation: Nursing   Occupation: [employer] RET[employer]  Tobacco Use    Smoking status: Former   Smokeless tobacco: Never  Vaping Use   Vaping status: Never Used  Substance and Sexual Activity   Alcohol use: No    Alcohol/week: 0.0 standard drinks of alcohol   Drug use: No   Sexual activity: Yes  Other Topics Concern   Not on file  Social History Narrative   Lives alone/2025   Social Drivers of Health   Financial Resource Strain: Low Risk  (04/21/2024)   Overall Financial Resource Strain (CARDIA)    Difficulty of Paying Living Expenses: Not very hard  Food Insecurity: No Food Insecurity (04/21/2024)   Hunger Vital Sign    Worried About Running Out of Food in the Last Year: Never true    Ran Out of Food in the Last Year: Never true  Transportation Needs: No Transportation Needs (04/21/2024)   PRAPARE - Administrator, Civil Service (Medical): No    Lack of Transportation (Non-Medical): No  Physical Activity: Insufficiently Active (04/21/2024)   Exercise Vital Sign    Days of Exercise per Week: 2 days    Minutes of Exercise per Session: 60 min  Stress: No Stress Concern Present (04/21/2024)   [employer] of Occupational Health - Occupational Stress Questionnaire    Feeling of Stress : Not at all  Social Connections: Moderately Integrated (04/21/2024)   Social Connection and Isolation Panel [NHANES]    Frequency of Communication with Friends and Family: Twice a week    Frequency of Social Gatherings with Friends and Family: Once a week    Attends Religious Services: More than 4 times per year    Active Member of [employer] or Organizations: Yes    Attends Banker Meetings: 1 to 4 times per year    Marital Status: Widowed    Tobacco Counseling Counseling given: Not Answered    Clinical Intake:  Pre-visit preparation completed: Yes  Pain : No/denies pain     BMI - recorded: 26.04 Nutritional Status: BMI 25 -29 Overweight Nutritional Risks: None Diabetes: No  Lab Results  Component Value Date   HGBA1C 6.1  08/19/2023   HGBA1C 6.2 02/12/2023     How often do you need to have someone help you when you read instructions, pamphlets, or other written materials from your doctor or pharmacy?: 1 - Never  Interpreter Needed?: No  Information entered by :: Aliha Diedrich, RMA   Activities of Daily Living     04/21/2024    8:16 AM  In your present state of health, do you have any difficulty performing the following activities:  Hearing? 0  Vision? 0  Difficulty concentrating or making decisions? 0  Walking or climbing stairs? 0  Dressing or bathing? 0  Doing errands, shopping? 0  Preparing Food and eating ? N  Using the Toilet? N  In the past six months, have you accidently leaked urine? N  Do you have  problems with loss of bowel control? N  Managing your Medications? N  Managing your Finances? N  Housekeeping or managing your Housekeeping? N    Patient Care Team: Plotnikov, Oakley Bellman, MD as PCP - General (Internal Medicine) Alphonzo Jenkins, CNM (Inactive) as Midwife (Obstetrics and Gynecology) [employer] as Catering manager Physician (Optometry) Jonette Nestle, [facility] as Consulting Physician (Obstetrics and Gynecology)  Indicate any recent Medical Services you may have received from other than [facility] providers in the past year (date may be approximate).     Assessment:    This is a routine wellness examination for [place].  Hearing/Vision screen Hearing Screening - Comments:: Denies hearing difficulties   Vision Screening - Comments:: Wears eyeglasses/[facility] [employer] [facility])   Goals Addressed             This Visit's Progress    Stay Active and Independent   On track      Why is this important?   Activity helps to keep your muscles strong.  You will sleep better and feel more relaxed.  You will have more energy and feel less stressed.  If you are not active now, start slowly. Little changes make a big difference.  Rest, but not too much.   Stay as active as you can and listen to your body's signals.            Depression Screen     04/21/2024    8:26 AM 08/19/2023    8:53 AM 04/18/2023    9:20 AM 02/12/2023    8:35 AM 11/08/2022    8:57 AM 11/07/2021    8:53 AM 08/10/2021   11:22 AM  PHQ 2/9 Scores  PHQ - 2 Score 0 0 0 0 0 0 0  PHQ- 9 Score 0      0    Fall Risk     04/21/2024    8:23 AM 08/19/2023    8:53 AM 04/18/2023    9:21 AM 02/12/2023    8:35 AM 11/08/2022    8:51 AM  Fall Risk   Falls in the past year? 0 0 0 0 0  Number falls in past yr: 0 0 0 0 0  Injury with Fall? 0 0 0 0 0  Risk for fall due to :  No Fall Risks No Fall Risks No Fall Risks No Fall Risks  Follow up Falls evaluation completed;Falls prevention discussed Falls evaluation completed Falls evaluation completed Falls evaluation completed Falls prevention discussed    MEDICARE RISK AT HOME:  Medicare Risk at Home Any stairs in or around the home?: No Home free of loose throw rugs in walkways, pet beds, electrical cords, etc?: Yes Adequate lighting in your home to reduce risk of falls?: Yes Life alert?: No Use of a cane, walker or w/c?: No Grab bars in the bathroom?: Yes Shower chair or bench in shower?: No Elevated toilet seat or a handicapped toilet?: No  TIMED UP AND GO:  Was the test performed?  No  Cognitive Function: Declined/Normal: No cognitive concerns noted by patient or family. Patient alert, oriented, able to answer questions appropriately and recall recent events. No signs of memory loss or confusion.        04/18/2023    9:26 AM 11/08/2022    8:54 AM 09/14/2020    8:55 AM  6CIT Screen  What Year? 0 points 0 points 0 points  What month? 0 points 0 points 0 points  What time? 0  points 0 points 0 points  Count back from 20 0 points 0 points 0 points  Months in reverse 0 points 0 points 0 points  Repeat phrase 4 points 0 points 0 points  Total Score 4 points 0 points 0 points    Immunizations Immunization History   Administered Date(s) Administered   Fluad Quad(high Dose 65+) 07/29/2019, 08/10/2021, 08/14/2022   Fluad Trivalent(High Dose 65+) 08/19/2023   Hepatitis B, ADULT 04/04/2015, 05/02/2015, 03/20/2016   Influenza Split 09/06/2016, 07/24/2017   Influenza,inj,Quad PF,6+ Mos 11/11/2013, 11/16/2014, 08/26/2015   Influenza-Unspecified 07/17/2018   PFIZER(Purple Top)SARS-COV-2 Vaccination 12/18/2019, 01/08/2020, 09/17/2020   PNEUMOCOCCAL CONJUGATE-20 11/15/2021   Pfizer(Comirnaty)Fall Seasonal Vaccine 12 years and older 08/28/2023   Pneumococcal Conjugate-13 07/11/2018   Tdap 11/14/2012, 12/22/2017   Zoster Recombinant(Shingrix ) 06/18/2023   Zoster, Live 06/16/2013    Screening Tests Health Maintenance  Topic Date Due   COVID-19 Vaccine (5 - 2024-25 season) 02/26/2024   INFLUENZA VACCINE  06/26/2024   MAMMOGRAM  08/02/2024   Medicare Annual Wellness (AWV)  04/21/2025   Colonoscopy  03/06/2026   DTaP/Tdap/Td (3 - Td or Tdap) 12/23/2027   Pneumonia Vaccine 17+ Years old  Completed   DEXA SCAN  Completed   Hepatitis C Screening  Completed   HPV VACCINES  Aged Out   Meningococcal B Vaccine  Aged Out   Zoster Vaccines- Shingrix   Discontinued    Health Maintenance  Health Maintenance Due  Topic Date Due   COVID-19 Vaccine (5 - 2024-25 season) 02/26/2024   Health Maintenance Items Addressed: See Nurse Notes  Additional Screening:  Vision Screening: Recommended annual ophthalmology exams for early detection of glaucoma and other disorders of the eye.  Dental Screening: Recommended annual dental exams for proper oral hygiene  Community Resource Referral / Chronic Care Management: CRR required this visit?  No   CCM required this visit?  No   Plan:    I have personally reviewed and noted the following in the patient's chart:   Medical and social history Use of alcohol, tobacco or illicit drugs  Current medications and supplements including opioid prescriptions. Patient is  not currently taking opioid prescriptions. Functional ability and status Nutritional status Physical activity Advanced directives List of other physicians Hospitalizations, surgeries, and ER visits in previous 12 months Vitals Screenings to include cognitive, depression, and falls Referrals and appointments  In addition, I have reviewed and discussed with patient certain preventive protocols, quality metrics, and best practice recommendations. A written personalized care plan for preventive services as well as general preventive health recommendations were provided to patient.   Violeta Lecount L Jerric Oyen, CMA   04/21/2024   After Visit Summary: (MyChart) Due to this being a telephonic visit, the after visit summary with patients personalized plan was offered to patient via MyChart   Notes: Please refer to Routing Comments.  Medical screening examination/treatment/procedure(s) were performed by non-physician practitioner and as supervising physician I was immediately available for consultation/collaboration.  I agree with above. Adelaide Holy, MD

## 2024-04-21 NOTE — Patient Instructions (Signed)
 Dana Long , Thank you for taking time out of your busy schedule to complete your Annual Wellness Visit with me. I enjoyed our conversation and look forward to speaking with you again next year. I, as well as your care team,  appreciate your ongoing commitment to your health goals. Please review the following plan we discussed and let me know if I can assist you in the future. Your Game plan/ To Do List   Follow up Visits: Next Medicare AWV with our clinical staff: 04/22/2025.   Have you seen your provider in the last 6 months (3 months if uncontrolled diabetes)? Yes Next Office Visit with your provider: 08/19/2024.  Clinician Recommendations:  Aim for 30 minutes of exercise or brisk walking, 6-8 glasses of water, and 5 servings of fruits and vegetables each day. Keep up the good work.      This is a list of the screening recommended for you and due dates:  Health Maintenance  Topic Date Due   COVID-19 Vaccine (5 - 2024-25 season) 02/26/2024   Flu Shot  06/26/2024   Mammogram  08/02/2024   Medicare Annual Wellness Visit  04/21/2025   Colon Cancer Screening  03/06/2026   DTaP/Tdap/Td vaccine (3 - Td or Tdap) 12/23/2027   Pneumonia Vaccine  Completed   DEXA scan (bone density measurement)  Completed   Hepatitis C Screening  Completed   HPV Vaccine  Aged Out   Meningitis B Vaccine  Aged Out   Zoster (Shingles) Vaccine  Discontinued    Advanced directives: (Copy Requested) Please bring a copy of your health care power of attorney and living will to the office to be added to your chart at your convenience. You can mail to [facility] [address] [place], [place] [address] or email to [email or web address] Advance Care Planning is important because it:  [x]  Makes sure you receive the medical care that is consistent with your values, goals, and preferences  [x]  It provides guidance to your family and loved ones and reduces their decisional burden about whether or not  they are making the right decisions based on your wishes.  Follow the link provided in your after visit summary or read over the paperwork we have mailed to you to help you started getting your Advance Directives in place. If you need assistance in completing these, please reach out to us  so that we can help you!  See attachments for Preventive Care and Fall Prevention Tips.

## 2024-05-15 DIAGNOSIS — H524 Presbyopia: Secondary | ICD-10-CM | POA: Diagnosis not present

## 2024-05-15 DIAGNOSIS — Z0100 Encounter for examination of eyes and vision without abnormal findings: Secondary | ICD-10-CM | POA: Diagnosis not present

## 2024-07-07 ENCOUNTER — Other Ambulatory Visit: Payer: Self-pay | Admitting: Internal Medicine

## 2024-08-19 ENCOUNTER — Encounter: Payer: Self-pay | Admitting: Internal Medicine

## 2024-08-19 ENCOUNTER — Ambulatory Visit: Admitting: Internal Medicine

## 2024-08-19 VITALS — BP 130/80 | HR 84 | Temp 98.6°F | Ht 59.75 in | Wt 145.4 lb

## 2024-08-19 DIAGNOSIS — Z0001 Encounter for general adult medical examination with abnormal findings: Secondary | ICD-10-CM | POA: Diagnosis not present

## 2024-08-19 DIAGNOSIS — I10 Essential (primary) hypertension: Secondary | ICD-10-CM | POA: Diagnosis not present

## 2024-08-19 DIAGNOSIS — Z23 Encounter for immunization: Secondary | ICD-10-CM

## 2024-08-19 DIAGNOSIS — Z13220 Encounter for screening for lipoid disorders: Secondary | ICD-10-CM | POA: Diagnosis not present

## 2024-08-19 DIAGNOSIS — R739 Hyperglycemia, unspecified: Secondary | ICD-10-CM

## 2024-08-19 DIAGNOSIS — L309 Dermatitis, unspecified: Secondary | ICD-10-CM

## 2024-08-19 DIAGNOSIS — Z0000 Encounter for general adult medical examination without abnormal findings: Secondary | ICD-10-CM

## 2024-08-19 LAB — URINALYSIS
Bilirubin Urine: NEGATIVE
Hgb urine dipstick: NEGATIVE
Ketones, ur: NEGATIVE
Leukocytes,Ua: NEGATIVE
Nitrite: NEGATIVE
Specific Gravity, Urine: <=1.005 — AB (ref 1.000–1.030)
Total Protein, Urine: NEGATIVE
Urine Glucose: NEGATIVE
Urobilinogen, UA: 0.2 (ref 0.0–1.0)
pH: 7 (ref 5.0–8.0)

## 2024-08-19 LAB — LIPID PANEL
Cholesterol: 182 mg/dL (ref 0–200)
HDL: 49.1 mg/dL (ref >39.00)
LDL Cholesterol: 117 mg/dL — ABNORMAL HIGH (ref 0–99)
NonHDL: 133.1
Total CHOL/HDL Ratio: 4
Triglycerides: 82 mg/dL (ref 0.0–149.0)
VLDL: 16.4 mg/dL (ref 0.0–40.0)

## 2024-08-19 LAB — CBC WITH DIFFERENTIAL/PLATELET
Basophils Absolute: 0 K/uL (ref 0.0–0.1)
Basophils Relative: 0.8 % (ref 0.0–3.0)
Eosinophils Absolute: 0.3 K/uL (ref 0.0–0.7)
Eosinophils Relative: 6.6 % — ABNORMAL HIGH (ref 0.0–5.0)
HCT: 40.7 % (ref 36.0–46.0)
Hemoglobin: 13.2 g/dL (ref 12.0–15.0)
Lymphocytes Relative: 42.6 % (ref 12.0–46.0)
Lymphs Abs: 1.9 K/uL (ref 0.7–4.0)
MCHC: 32.5 g/dL (ref 30.0–36.0)
MCV: 82.2 fl (ref 78.0–100.0)
Monocytes Absolute: 0.3 K/uL (ref 0.1–1.0)
Monocytes Relative: 7.4 % (ref 3.0–12.0)
Neutro Abs: 1.9 K/uL (ref 1.4–7.7)
Neutrophils Relative %: 42.6 % — ABNORMAL LOW (ref 43.0–77.0)
Platelets: 306 K/uL (ref 150.0–400.0)
RBC: 4.95 Mil/uL (ref 3.87–5.11)
RDW: 13.5 % (ref 11.5–15.5)
WBC: 4.5 K/uL (ref 4.0–10.5)

## 2024-08-19 LAB — COMPREHENSIVE METABOLIC PANEL WITH GFR
ALT: 19 U/L (ref 0–35)
AST: 24 U/L (ref 0–37)
Albumin: 4.5 g/dL (ref 3.5–5.2)
Alkaline Phosphatase: 45 U/L (ref 39–117)
BUN: 13 mg/dL (ref 6–23)
CO2: 32 meq/L (ref 19–32)
Calcium: 10.1 mg/dL (ref 8.4–10.5)
Chloride: 99 meq/L (ref 96–112)
Creatinine, Ser: 0.76 mg/dL (ref 0.40–1.20)
GFR: 78.42 mL/min (ref >60.00)
Glucose, Bld: 107 mg/dL — ABNORMAL HIGH (ref 70–99)
Potassium: 4.1 meq/L (ref 3.5–5.1)
Sodium: 137 meq/L (ref 135–145)
Total Bilirubin: 0.5 mg/dL (ref 0.2–1.2)
Total Protein: 7.6 g/dL (ref 6.0–8.3)

## 2024-08-19 LAB — TSH: TSH: 0.29 u[IU]/mL — ABNORMAL LOW (ref 0.35–5.50)

## 2024-08-19 LAB — HEMOGLOBIN A1C: Hgb A1c MFr Bld: 6.4 % (ref 4.6–6.5)

## 2024-08-19 MED ORDER — TELMISARTAN 40 MG PO TABS: 40.0000 mg | ORAL_TABLET | Freq: Every day | ORAL | 3 refills | Status: AC

## 2024-08-19 MED ORDER — TRIAMCINOLONE ACETONIDE 0.5 % EX CREA: 1.0000 | TOPICAL_CREAM | Freq: Three times a day (TID) | CUTANEOUS | 2 refills | Status: AC

## 2024-08-19 MED ORDER — IBUPROFEN 600 MG PO TABS: ORAL_TABLET | ORAL | 2 refills | Status: AC

## 2024-08-19 NOTE — Patient Instructions (Signed)
 Dana Long

## 2024-08-19 NOTE — Progress Notes (Signed)
 Subjective:  Patient ID: Dana Long, female    DOB: 11-03-1951  Age: 73 y.o. MRN: [id number]  CC: Annual Exam (Patient needs ibuprofen  and telmisartan  refilled Epic wont let me pen the ibuprofen . )   HPI [name] presents for well exam C/o rash - itching  Outpatient Medications Prior to Visit  Medication Sig Dispense Refill   amLODipine  (NORVASC ) 5 MG tablet TAKE 1 TABLET (5 MG TOTAL) BY MOUTH DAILY. 90 tablet 3   Calcium-Phosphorus-Vitamin D  (CITRACAL +D3 PO) Take 1 each by mouth daily. Reported on 02/21/2016     fluticasone  (FLONASE ) 50 MCG/ACT nasal spray SPRAY 2 SPRAYS INTO EACH NOSTRIL EVERY DAY 48 mL 2   ipratropium (ATROVENT ) 0.06 % nasal spray USE 2 SPRAYS IN EACH NOSTRIL 3 TIMES A DAY 45 mL 2   ibuprofen  (ADVIL ) 600 MG tablet TAKE 1 TABLET TWICE A DAY FOR 1 WEEK THEN AS NEEDED FOR PAIN 60 tablet 2   telmisartan  (MICARDIS ) 40 MG tablet TAKE 1 TABLET BY MOUTH DAILY. ANNUAL APPT DUE IN SEPT MUST SEE PROVIDER FOR FUTURE REFILLS 90 tablet 1   mupirocin  ointment (BACTROBAN ) 2 % On leg wound w/dressing change qd or bid (Patient not taking: Reported on 08/19/2024) 30 g 0   No facility-administered medications prior to visit.    ROS: Review of Systems  Constitutional:  Negative for activity change, appetite change, chills, fatigue and unexpected weight change.  HENT:  Negative for congestion, mouth sores and sinus pressure.   Eyes:  Negative for visual disturbance.  Respiratory:  Negative for cough and chest tightness.   Gastrointestinal:  Negative for abdominal pain and nausea.  Genitourinary:  Negative for difficulty urinating, frequency and vaginal pain.  Musculoskeletal:  Negative for back pain and gait problem.  Skin:  Positive for rash. Negative for pallor.  Neurological:  Negative for dizziness, tremors, weakness, numbness and headaches.  Psychiatric/Behavioral:  Negative for confusion and sleep disturbance.     Objective:  BP 130/80   Pulse 84   Temp 98.6 F  (37 C) (Oral)   Ht 4' 11.75 (1.518 m)   Wt 145 lb 6.4 oz (66 kg)   SpO2 99%   BMI 28.63 kg/m   BP Readings from Last 3 Encounters:  08/19/24 130/80  08/19/23 122/80  02/12/23 120/80    Wt Readings from Last 3 Encounters:  08/19/24 145 lb 6.4 oz (66 kg)  04/21/24 147 lb (66.7 kg)  08/19/23 147 lb (66.7 kg)    Physical Exam Constitutional:      General: She is not in acute distress.    Appearance: Normal appearance. She is well-developed.  HENT:     Head: Normocephalic.     Right Ear: External ear normal.     Left Ear: External ear normal.     Nose: Nose normal.  Eyes:     General:        Right eye: No discharge.        Left eye: No discharge.     Conjunctiva/sclera: Conjunctivae normal.     Pupils: Pupils are equal, round, and reactive to light.  Neck:     Thyroid : No thyromegaly.     Vascular: No JVD.     Trachea: No tracheal deviation.  Cardiovascular:     Rate and Rhythm: Normal rate and regular rhythm.     Heart sounds: Normal heart sounds.  Pulmonary:     Effort: No respiratory distress.     Breath sounds: No stridor. No wheezing.  Abdominal:     General: Bowel sounds are normal. There is no distension.     Palpations: Abdomen is soft. There is no mass.     Tenderness: There is no abdominal tenderness. There is no guarding or rebound.  Musculoskeletal:        General: No tenderness.     Cervical back: Normal range of motion and neck supple. No rigidity.  Lymphadenopathy:     Cervical: No cervical adenopathy.  Skin:    Findings: No erythema or rash.  Neurological:     Cranial Nerves: No cranial nerve deficit.     Motor: No abnormal muscle tone.     Coordination: Coordination normal.     Deep Tendon Reflexes: Reflexes normal.  Psychiatric:        Behavior: Behavior normal.        Thought Content: Thought content normal.        Judgment: Judgment normal.   Flexural eczema rash  Lab Results  Component Value Date   WBC 4.2 08/19/2023   HGB 12.9  08/19/2023   HCT 40.3 08/19/2023   PLT 264.0 08/19/2023   GLUCOSE 104 (H) 08/19/2023   CHOL 194 08/19/2023   TRIG 79.0 08/19/2023   HDL 51.70 08/19/2023   LDLCALC 127 (H) 08/19/2023   ALT 21 08/19/2023   AST 25 08/19/2023   NA 138 08/19/2023   K 3.8 08/19/2023   CL 104 08/19/2023   CREATININE 0.81 08/19/2023   BUN 11 08/19/2023   CO2 26 08/19/2023   TSH 0.28 (L) 08/19/2023   INR 1.12 03/21/2015   HGBA1C 6.1 08/19/2023    CT CARDIAC SCORING ([facility] LOCATIONS ONLY) Result Date: 09/27/2022 CLINICAL DATA:  73 year old black female * Tracking Code: FCC * EXAM: CT CARDIAC CORONARY ARTERY CALCIUM SCORE TECHNIQUE: Non-contrast imaging through the heart was performed using prospective ECG gating. Image post processing was performed on an independent workstation, allowing for quantitative analysis of the heart and coronary arteries. Note that this exam targets the heart and the chest was not imaged in its entirety. COMPARISON:  None available. FINDINGS: CORONARY CALCIUM SCORES: Left Main: 0 LAD: 5 LCx: 0 RCA: 0 Total Agatston Score: 0 MESA database percentile: 54 AORTA MEASUREMENTS: Ascending Aorta: 3.0 cm Descending Aorta:2.2 cm OTHER FINDINGS: Vascular: Normal heart size. Mitral annular calcifications. No pericardial effusion. Normal caliber thoracic aorta with mild scattered calcified plaque. Mediastinum/Nodes: Small hiatal hernia. No pathologically enlarged lymph nodes seen in the chest. Lungs/Pleura: Central airways are patent. No consolidation, pleural effusion or pneumothorax. Upper Abdomen: No acute abnormality. Musculoskeletal: No chest wall mass or suspicious bone lesions identified. IMPRESSION: 1. Total calcium score of 5 is at percentile 54 for subjects of the same age, gender, and race/ethnicity. 2. Aortic Atherosclerosis (ICD10-I70.0). Electronically Signed   By: Rea Marc M.D.   On: 09/27/2022 14:01    Assessment & Plan:   Problem List Items Addressed This Visit     Dermatitis,  unspecified   Flexural eczema rash Triamc cream Aquaphore prn ?nickel dermatitis      Essential hypertension    Cont Micardis  40 mg/d and Norvasc  5 mg/d      Relevant Medications   telmisartan  (MICARDIS ) 40 MG tablet   Well adult exam    We discussed age appropriate health related issues, including available/recomended screening tests and vaccinations. Labs were ordered to be later reviewed . All questions were answered. We discussed one or more of the following - seat belt use, use of sunscreen/sun exposure exercise, fall  risk reduction, second hand smoke exposure, firearm use and storage, seat belt use, a need for adhering to healthy diet and exercise. Labs were ordered.  All questions were answered. Due PAP, mammo Ophth exam q 1 year Colon due in 2027  Coronary calcium score chest CT 2023 - B had MI      Relevant Orders   TSH   Urinalysis   CBC with Differential/Platelet   Lipid panel   Comprehensive metabolic panel with GFR   Hemoglobin A1c   Other Visit Diagnoses       Immunization due    -  Primary   Relevant Orders   Flu vaccine HIGH DOSE PF(Fluzone Trivalent) (Completed)     Hyperglycemia       Relevant Orders   Hemoglobin A1c         Meds ordered this encounter  Medications   telmisartan  (MICARDIS ) 40 MG tablet    Sig: Take 1 tablet (40 mg total) by mouth daily.    Dispense:  90 tablet    Refill:  3    NEEDS MORE REFILLS.   triamcinolone  cream (KENALOG ) 0.5 %    Sig: Apply 1 Application topically 3 (three) times daily.    Dispense:  120 g    Refill:  2   ibuprofen  (ADVIL ) 600 MG tablet    Sig: TAKE 1 TABLET TWICE A DAY AS NEEDED FOR PAIN    Dispense:  60 tablet    Refill:  2      Follow-up: Return in about 3 months (around 11/18/2024) for a follow-up visit.  Marolyn Noel, MD

## 2024-08-19 NOTE — Assessment & Plan Note (Signed)
Cont Micardis 40 mg/d and Norvasc 5 mg/d

## 2024-08-19 NOTE — Assessment & Plan Note (Addendum)
 Flexural eczema rash Triamc cream Aquaphore prn ?nickel dermatitis

## 2024-08-19 NOTE — Assessment & Plan Note (Signed)
  We discussed age appropriate health related issues, including available/recomended screening tests and vaccinations. Labs were ordered to be later reviewed . All questions were answered. We discussed one or more of the following - seat belt use, use of sunscreen/sun exposure exercise, fall risk reduction, second hand smoke exposure, firearm use and storage, seat belt use, a need for adhering to healthy diet and exercise. Labs were ordered.  All questions were answered. Due PAP, mammo Ophth exam q 1 year Colon due in 2027  Coronary calcium score chest CT 2023 - B had MI

## 2024-08-25 ENCOUNTER — Ambulatory Visit: Payer: Self-pay | Admitting: Internal Medicine

## 2024-08-25 DIAGNOSIS — R7989 Other specified abnormal findings of blood chemistry: Secondary | ICD-10-CM

## 2024-08-25 DIAGNOSIS — R739 Hyperglycemia, unspecified: Secondary | ICD-10-CM

## 2024-08-25 DIAGNOSIS — I10 Essential (primary) hypertension: Secondary | ICD-10-CM

## 2025-04-22 ENCOUNTER — Ambulatory Visit
